# Patient Record
Sex: Male | Born: 1955 | Race: White | Hispanic: No | Marital: Married | Smoking: Never smoker
Health system: Southern US, Community
[De-identification: ages and names within clinical notes are randomized; demographics above are authoritative.]

## PROBLEM LIST (undated history)

## (undated) DIAGNOSIS — N200 Calculus of kidney: Secondary | ICD-10-CM

## (undated) HISTORY — DX: Calculus of kidney: N20.0

---

## 2000-05-15 ENCOUNTER — Emergency Department (HOSPITAL_COMMUNITY): Admission: EM | Admit: 2000-05-15 | Discharge: 2000-05-15 | Payer: Self-pay | Admitting: Emergency Medicine

## 2004-04-07 ENCOUNTER — Encounter: Admission: RE | Admit: 2004-04-07 | Discharge: 2004-05-03 | Payer: Self-pay | Admitting: Family Medicine

## 2004-04-09 ENCOUNTER — Emergency Department (HOSPITAL_COMMUNITY): Admission: EM | Admit: 2004-04-09 | Discharge: 2004-04-09 | Payer: Self-pay | Admitting: Emergency Medicine

## 2008-05-21 ENCOUNTER — Emergency Department (HOSPITAL_COMMUNITY): Admission: EM | Admit: 2008-05-21 | Discharge: 2008-05-21 | Payer: Self-pay | Admitting: Emergency Medicine

## 2008-08-30 ENCOUNTER — Emergency Department (HOSPITAL_COMMUNITY): Admission: EM | Admit: 2008-08-30 | Discharge: 2008-08-30 | Payer: Self-pay | Admitting: Emergency Medicine

## 2008-10-09 ENCOUNTER — Encounter: Admission: RE | Admit: 2008-10-09 | Discharge: 2008-10-09 | Payer: Self-pay | Admitting: Family Medicine

## 2010-07-19 LAB — URINALYSIS, ROUTINE W REFLEX MICROSCOPIC
Glucose, UA: NEGATIVE mg/dL
Specific Gravity, Urine: 1.021 (ref 1.005–1.030)

## 2010-07-19 LAB — URINE MICROSCOPIC-ADD ON

## 2010-09-06 ENCOUNTER — Emergency Department (HOSPITAL_COMMUNITY): Payer: BC Managed Care – PPO

## 2010-09-06 ENCOUNTER — Emergency Department (HOSPITAL_COMMUNITY)
Admission: EM | Admit: 2010-09-06 | Discharge: 2010-09-07 | Disposition: A | Discharge status: Home / Self Care | Payer: BC Managed Care – PPO | Attending: Emergency Medicine | Admitting: Emergency Medicine

## 2010-09-06 DIAGNOSIS — Z87442 Personal history of urinary calculi: Secondary | ICD-10-CM | POA: Insufficient documentation

## 2010-09-06 DIAGNOSIS — E119 Type 2 diabetes mellitus without complications: Secondary | ICD-10-CM | POA: Insufficient documentation

## 2010-09-06 DIAGNOSIS — R0789 Other chest pain: Secondary | ICD-10-CM | POA: Insufficient documentation

## 2010-09-07 LAB — DIFFERENTIAL
Basophils Absolute: 0 10*3/uL (ref 0.0–0.1)
Eosinophils Relative: 4 % (ref 0–5)
Lymphocytes Relative: 32 % (ref 12–46)
Neutro Abs: 4.3 10*3/uL (ref 1.7–7.7)
Neutrophils Relative %: 54 % (ref 43–77)

## 2010-09-07 LAB — COMPREHENSIVE METABOLIC PANEL
ALT: 20 U/L (ref 0–53)
Calcium: 10.8 mg/dL — ABNORMAL HIGH (ref 8.4–10.5)
Glucose, Bld: 119 mg/dL — ABNORMAL HIGH (ref 70–99)
Sodium: 138 mEq/L (ref 135–145)
Total Protein: 7.5 g/dL (ref 6.0–8.3)

## 2010-09-07 LAB — CBC
HCT: 41.4 % (ref 39.0–52.0)
Hemoglobin: 14.9 g/dL (ref 13.0–17.0)
RDW: 12.5 % (ref 11.5–15.5)
WBC: 8 10*3/uL (ref 4.0–10.5)

## 2010-09-07 LAB — CK TOTAL AND CKMB (NOT AT ARMC)
CK, MB: 2.2 ng/mL (ref 0.3–4.0)
Total CK: 88 U/L (ref 7–232)

## 2010-09-16 ENCOUNTER — Institutional Professional Consult (permissible substitution): Payer: BC Managed Care – PPO | Admitting: Internal Medicine

## 2010-09-22 ENCOUNTER — Encounter: Payer: Self-pay | Admitting: Internal Medicine

## 2010-09-23 ENCOUNTER — Encounter: Payer: Self-pay | Admitting: Internal Medicine

## 2010-09-23 ENCOUNTER — Ambulatory Visit (INDEPENDENT_AMBULATORY_CARE_PROVIDER_SITE_OTHER): Payer: BC Managed Care – PPO | Admitting: Internal Medicine

## 2010-09-23 VITALS — BP 110/90 | HR 80 | Resp 18 | Ht 74.0 in | Wt 225.0 lb

## 2010-09-23 DIAGNOSIS — R002 Palpitations: Secondary | ICD-10-CM

## 2010-09-23 DIAGNOSIS — R0789 Other chest pain: Secondary | ICD-10-CM

## 2010-09-23 DIAGNOSIS — R079 Chest pain, unspecified: Secondary | ICD-10-CM | POA: Insufficient documentation

## 2010-09-23 NOTE — Patient Instructions (Addendum)
Your physician has requested that you have an echocardiogram. Echocardiography is a painless test that uses sound waves to create images of your heart. It provides your doctor with information about the size and shape of your heart and how well your heart's chambers and valves are working. This procedure takes approximately one hour. There are no restrictions for this procedure.   Your physician has requested that you have an exercise tolerance test. For further information please visit [email or web address] Please also follow instruction sheet, as given.--with PA   Your physician recommends that you schedule a follow-up appointment in: 6 weeks with Dr Ladona Ridgel

## 2010-09-23 NOTE — Assessment & Plan Note (Signed)
I am not convinced he is having anginal symptoms, but he has some cardiac risk factors and I have asked him to undergo exercise testing. This will be scheduled at the earliest possible time.

## 2010-09-23 NOTE — Assessment & Plan Note (Signed)
The etiology of his symptoms is unclear but is most likely due to SVT. I have asked him to call us if his symptoms worsen. He can come to the office for an ECG if possible. If his symptoms persist but are not caught, then a cardiac monitor will be recommended.

## 2010-09-23 NOTE — Progress Notes (Signed)
HPI Mr. Max Black is referred today by Dr. Duane Lope for evaluation of chest pain and palpitations. 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 His symptoms began several months ago when he began to experience palpitations. He notes that his heart would suddenly feel like it was racing, last for a few minutes and then stop suddenly. He has not experienced syncope, but when he feels palpitations, he experiences dyspnea and minimal chest pressure. At other times he will experience chest discomfort which is not necessarily related to exertion. No family h/o premature CAD. Allergies  Allergen Reactions  . Statins      No current outpatient prescriptions on file.     Past Medical History  Diagnosis Date  . Diabetes mellitus   . Kidney stone     ROS:   All systems reviewed and negative except as noted in the HPI.   No past surgical history on file.   No family history on file.   History   Social History  . Marital Status: Single    Spouse Name: N/A    Number of Children: N/A  . Years of Education: N/A   Occupational History  . Not on file.   Social History Main Topics  . Smoking status: Never Smoker   . Smokeless tobacco: Not on file  . Alcohol Use: No  . Drug Use: No  . Sexually Active: Not on file   Other Topics Concern  . Not on file   Social History Narrative  . No narrative on file     BP 110/90  Pulse 80  Resp 18  Ht 6\' 2"  (1.88 m)  Wt 225 lb (102.059 kg)  BMI 28.89 kg/m2  Physical Exam:  Overweight but Well appearing NAD HEENT: Unremarkable Neck:  No JVD, no thyromegally Lymphatics:  No adenopathy Back:  No CVA tenderness Lungs:  Clear with no wheezes, rales, or rhonchi HEART:  Regular rate rhythm, no murmurs, no rubs, no clicks Abd:   positive bowel sounds, no organomegally, no rebound, no guarding Ext:  2 plus pulses, no edema, no cyanosis, no clubbing Skin:  No rashes no nodules Neuro:  CN II through XII intact, motor grossly  intact  EKG NSR with no ventricular pre-excitation   Assess/Plan:

## 2010-09-29 ENCOUNTER — Encounter: Payer: Self-pay | Admitting: *Deleted

## 2010-10-12 ENCOUNTER — Ambulatory Visit (HOSPITAL_COMMUNITY): Payer: BC Managed Care – PPO | Attending: Internal Medicine | Admitting: Radiology

## 2010-10-12 ENCOUNTER — Ambulatory Visit (INDEPENDENT_AMBULATORY_CARE_PROVIDER_SITE_OTHER): Payer: BC Managed Care – PPO | Admitting: Physician Assistant

## 2010-10-12 DIAGNOSIS — R072 Precordial pain: Secondary | ICD-10-CM | POA: Insufficient documentation

## 2010-10-12 DIAGNOSIS — I379 Nonrheumatic pulmonary valve disorder, unspecified: Secondary | ICD-10-CM | POA: Insufficient documentation

## 2010-10-12 DIAGNOSIS — R0789 Other chest pain: Secondary | ICD-10-CM

## 2010-10-12 DIAGNOSIS — R002 Palpitations: Secondary | ICD-10-CM | POA: Insufficient documentation

## 2010-10-12 NOTE — Progress Notes (Signed)
Exercise Treadmill Test  Pre-Exercise Testing Evaluation Rhythm: normal sinus  Rate: 82   PR:  .16 QRS:  .08  QT:  .38 QTc: .44     Test  Exercise Tolerance Test Ordering MD: Lewayne Bunting, MD  Interpreting MD:  Tereso Newcomer  Unique Test No: 1  Treadmill:  1  Indication for ETT: chest pain - rule out ischemia  Contraindication to ETT: No   Stress Modality: exercise - treadmill  Cardiac Imaging Performed: non   Protocol: standard Bruce - submaximal  Max BP:191/87  Max MPHR (bpm): 165 85% MPR (bpm):  140  MPHR obtained (bpm):  169 % MPHR obtained:  102  Reached 85% MPHR (min:sec):  2:30 Total Exercise Time (min-sec):  5:03  Workload in METS:8.5 Borg Scale: 15  Reason ETT Terminated:  desired heart rate attained    ST Segment Analysis At Rest: normal ST segments - no evidence of significant ST depression With Exercise: no evidence of significant ST depression  Other Information Arrhythmia:  No Angina during ETT:  absent (0) Quality of ETT:  diagnostic  ETT Interpretation:  normal - no evidence of ischemia by ST analysis  Comments: Fair exercise tolerance. Normal BP response to exercise. No chest pain. No ST-T changes to suggest ischemia. PVC, PAC noted  Recommendations: Follow up with Dr. Ladona Ridgel as directed.

## 2010-11-01 ENCOUNTER — Encounter: Payer: Self-pay | Admitting: Internal Medicine

## 2010-11-01 ENCOUNTER — Ambulatory Visit (INDEPENDENT_AMBULATORY_CARE_PROVIDER_SITE_OTHER): Payer: BC Managed Care – PPO | Admitting: Internal Medicine

## 2010-11-01 DIAGNOSIS — R079 Chest pain, unspecified: Secondary | ICD-10-CM

## 2010-11-01 DIAGNOSIS — R002 Palpitations: Secondary | ICD-10-CM

## 2010-11-01 NOTE — Assessment & Plan Note (Signed)
His symptoms appear to be well controlled. I've recommended a period of watchful waiting. If he has recurrent palpitations and the next step would be to have him wear a three-week cardiac monitor. Alternatively, if he has sustained heart racing he is welcome to come by our office for an EKG. Should he have syncope or severe chest pain with this and he is instructed to go to the emergency room.

## 2010-11-01 NOTE — Progress Notes (Signed)
HPI Mr. Max Black returns today for followup. He is a very pleasant middle-aged man with a history of palpitations and associated chest pressure. The patient has had no documented arrhythmias. When I saw him last, he underwent exercise testing and echocardiography. The results demonstrate normal left ventricular systolic function, no exercise induced arrhythmias, and no inducible ischemia with exercise. Since we last saw each other, he has had no sustained palpitations. He denies chest pain, peripheral edema, or shortness of breath. Allergies  Allergen Reactions  . Statins      No current outpatient prescriptions on file.     Past Medical History  Diagnosis Date  . Diabetes mellitus   . Kidney stone     ROS:   All systems reviewed and negative except as noted in the HPI.   No past surgical history on file.   No family history on file.   History   Social History  . Marital Status: Single    Spouse Name: N/A    Number of Children: N/A  . Years of Education: N/A   Occupational History  . Not on file.   Social History Main Topics  . Smoking status: Never Smoker   . Smokeless tobacco: Not on file  . Alcohol Use: No  . Drug Use: No  . Sexually Active: Not on file   Other Topics Concern  . Not on file   Social History Narrative  . No narrative on file     BP 120/78  Pulse 78  Ht 6\' 3"  (1.905 m)  Wt 227 lb (102.967 kg)  BMI 28.37 kg/m2  Physical Exam:  Well appearing NAD HEENT: Unremarkable Neck:  No JVD, no thyromegally Lymphatics:  No adenopathy Back:  No CVA tenderness Lungs:  Clear with no wheezes, rales, or rhonchi. HEART:  Regular rate rhythm, no murmurs, no rubs, no clicks Abd:  soft, positive bowel sounds, no organomegally, no rebound, no guarding Ext:  2 plus pulses, no edema, no cyanosis, no clubbing Skin:  No rashes no nodules Neuro:  CN II through XII intact, motor grossly intact  Assess/Plan:

## 2010-11-01 NOTE — Patient Instructions (Signed)
Your physician recommends that you schedule a follow-up appointment as needed  

## 2010-11-01 NOTE — Assessment & Plan Note (Signed)
He has had no recurrent symptoms. He will undergo a period of watchful waiting.

## 2014-08-03 ENCOUNTER — Other Ambulatory Visit: Payer: Self-pay | Admitting: Family Medicine

## 2014-08-03 DIAGNOSIS — R109 Unspecified abdominal pain: Secondary | ICD-10-CM

## 2014-08-07 ENCOUNTER — Other Ambulatory Visit: Payer: Self-pay

## 2016-06-19 DIAGNOSIS — F909 Attention-deficit hyperactivity disorder, unspecified type: Secondary | ICD-10-CM | POA: Diagnosis not present

## 2016-06-19 DIAGNOSIS — Z79899 Other long term (current) drug therapy: Secondary | ICD-10-CM | POA: Diagnosis not present

## 2016-06-19 DIAGNOSIS — F902 Attention-deficit hyperactivity disorder, combined type: Secondary | ICD-10-CM | POA: Diagnosis not present

## 2016-06-20 DIAGNOSIS — E1165 Type 2 diabetes mellitus with hyperglycemia: Secondary | ICD-10-CM | POA: Diagnosis not present

## 2016-06-21 DIAGNOSIS — E1165 Type 2 diabetes mellitus with hyperglycemia: Secondary | ICD-10-CM | POA: Diagnosis not present

## 2016-09-18 DIAGNOSIS — Z79899 Other long term (current) drug therapy: Secondary | ICD-10-CM | POA: Diagnosis not present

## 2016-09-18 DIAGNOSIS — F909 Attention-deficit hyperactivity disorder, unspecified type: Secondary | ICD-10-CM | POA: Diagnosis not present

## 2016-12-19 DIAGNOSIS — Z79899 Other long term (current) drug therapy: Secondary | ICD-10-CM | POA: Diagnosis not present

## 2016-12-19 DIAGNOSIS — E119 Type 2 diabetes mellitus without complications: Secondary | ICD-10-CM | POA: Diagnosis not present

## 2016-12-19 DIAGNOSIS — F909 Attention-deficit hyperactivity disorder, unspecified type: Secondary | ICD-10-CM | POA: Diagnosis not present

## 2017-03-15 DIAGNOSIS — F909 Attention-deficit hyperactivity disorder, unspecified type: Secondary | ICD-10-CM | POA: Diagnosis not present

## 2017-03-15 DIAGNOSIS — E119 Type 2 diabetes mellitus without complications: Secondary | ICD-10-CM | POA: Diagnosis not present

## 2017-03-15 DIAGNOSIS — F902 Attention-deficit hyperactivity disorder, combined type: Secondary | ICD-10-CM | POA: Diagnosis not present

## 2017-03-15 DIAGNOSIS — Z79899 Other long term (current) drug therapy: Secondary | ICD-10-CM | POA: Diagnosis not present

## 2017-03-20 DIAGNOSIS — N486 Induration penis plastica: Secondary | ICD-10-CM | POA: Diagnosis not present

## 2017-03-20 DIAGNOSIS — N5201 Erectile dysfunction due to arterial insufficiency: Secondary | ICD-10-CM | POA: Diagnosis not present

## 2017-04-24 DIAGNOSIS — E1165 Type 2 diabetes mellitus with hyperglycemia: Secondary | ICD-10-CM | POA: Diagnosis not present

## 2017-04-24 DIAGNOSIS — Z0000 Encounter for general adult medical examination without abnormal findings: Secondary | ICD-10-CM | POA: Diagnosis not present

## 2017-04-26 DIAGNOSIS — E1165 Type 2 diabetes mellitus with hyperglycemia: Secondary | ICD-10-CM | POA: Diagnosis not present

## 2017-04-26 DIAGNOSIS — E7800 Pure hypercholesterolemia, unspecified: Secondary | ICD-10-CM | POA: Diagnosis not present

## 2017-04-26 DIAGNOSIS — K130 Diseases of lips: Secondary | ICD-10-CM | POA: Diagnosis not present

## 2017-04-26 DIAGNOSIS — Z0000 Encounter for general adult medical examination without abnormal findings: Secondary | ICD-10-CM | POA: Diagnosis not present

## 2017-06-12 DIAGNOSIS — Z79899 Other long term (current) drug therapy: Secondary | ICD-10-CM | POA: Diagnosis not present

## 2017-06-12 DIAGNOSIS — F909 Attention-deficit hyperactivity disorder, unspecified type: Secondary | ICD-10-CM | POA: Diagnosis not present

## 2017-07-23 DIAGNOSIS — E7800 Pure hypercholesterolemia, unspecified: Secondary | ICD-10-CM | POA: Diagnosis not present

## 2017-07-23 DIAGNOSIS — E1165 Type 2 diabetes mellitus with hyperglycemia: Secondary | ICD-10-CM | POA: Diagnosis not present

## 2017-07-25 DIAGNOSIS — E7800 Pure hypercholesterolemia, unspecified: Secondary | ICD-10-CM | POA: Diagnosis not present

## 2017-07-25 DIAGNOSIS — E1165 Type 2 diabetes mellitus with hyperglycemia: Secondary | ICD-10-CM | POA: Diagnosis not present

## 2017-09-13 DIAGNOSIS — F909 Attention-deficit hyperactivity disorder, unspecified type: Secondary | ICD-10-CM | POA: Diagnosis not present

## 2017-09-13 DIAGNOSIS — Z79899 Other long term (current) drug therapy: Secondary | ICD-10-CM | POA: Diagnosis not present

## 2017-11-13 DIAGNOSIS — E1165 Type 2 diabetes mellitus with hyperglycemia: Secondary | ICD-10-CM | POA: Diagnosis not present

## 2017-11-15 DIAGNOSIS — E1165 Type 2 diabetes mellitus with hyperglycemia: Secondary | ICD-10-CM | POA: Diagnosis not present

## 2017-11-30 ENCOUNTER — Encounter (HOSPITAL_COMMUNITY): Payer: Self-pay | Admitting: *Deleted

## 2017-11-30 ENCOUNTER — Emergency Department (HOSPITAL_COMMUNITY)
Admission: EM | Admit: 2017-11-30 | Discharge: 2017-11-30 | Disposition: A | Discharge status: Home / Self Care | Payer: BLUE CROSS/BLUE SHIELD | Attending: Emergency Medicine | Admitting: Emergency Medicine

## 2017-11-30 ENCOUNTER — Other Ambulatory Visit: Payer: Self-pay

## 2017-11-30 ENCOUNTER — Emergency Department (HOSPITAL_COMMUNITY): Payer: BLUE CROSS/BLUE SHIELD

## 2017-11-30 DIAGNOSIS — N200 Calculus of kidney: Secondary | ICD-10-CM | POA: Insufficient documentation

## 2017-11-30 DIAGNOSIS — N130 Hydronephrosis with ureteropelvic junction obstruction: Secondary | ICD-10-CM | POA: Insufficient documentation

## 2017-11-30 DIAGNOSIS — E119 Type 2 diabetes mellitus without complications: Secondary | ICD-10-CM | POA: Diagnosis not present

## 2017-11-30 DIAGNOSIS — N201 Calculus of ureter: Secondary | ICD-10-CM | POA: Diagnosis not present

## 2017-11-30 DIAGNOSIS — Z79899 Other long term (current) drug therapy: Secondary | ICD-10-CM | POA: Diagnosis not present

## 2017-11-30 DIAGNOSIS — R1031 Right lower quadrant pain: Secondary | ICD-10-CM | POA: Diagnosis not present

## 2017-11-30 DIAGNOSIS — Z7984 Long term (current) use of oral hypoglycemic drugs: Secondary | ICD-10-CM | POA: Diagnosis not present

## 2017-11-30 LAB — I-STAT CHEM 8, ED
BUN: 25 mg/dL — ABNORMAL HIGH (ref 8–23)
Calcium, Ion: 1.17 mmol/L (ref 1.15–1.40)
Chloride: 99 mmol/L (ref 98–111)
Creatinine, Ser: 1.3 mg/dL — ABNORMAL HIGH (ref 0.61–1.24)
Glucose, Bld: 361 mg/dL — ABNORMAL HIGH (ref 70–99)
HCT: 45 % (ref 39.0–52.0)
Hemoglobin: 15.3 g/dL (ref 13.0–17.0)
Potassium: 3.9 mmol/L (ref 3.5–5.1)
Sodium: 134 mmol/L — ABNORMAL LOW (ref 135–145)
TCO2: 24 mmol/L (ref 22–32)

## 2017-11-30 LAB — URINALYSIS, ROUTINE W REFLEX MICROSCOPIC
Bacteria, UA: NONE SEEN
Bilirubin Urine: NEGATIVE
Glucose, UA: >=500 mg/dL — AB
Hgb urine dipstick: NEGATIVE
Ketones, ur: 5 mg/dL — AB
Leukocytes, UA: NEGATIVE
Nitrite: NEGATIVE
Protein, ur: NEGATIVE mg/dL
Specific Gravity, Urine: 1.026 (ref 1.005–1.030)
pH: 6 (ref 5.0–8.0)

## 2017-11-30 MED ORDER — HYDROMORPHONE HCL 1 MG/ML IJ SOLN
1.0000 mg | Freq: Once | INTRAMUSCULAR | Status: AC
  Administered 2017-11-30: 1 mg via INTRAVENOUS
  Filled 2017-11-30: qty 1

## 2017-11-30 MED ORDER — KETOROLAC TROMETHAMINE 30 MG/ML IJ SOLN
15.0000 mg | Freq: Once | INTRAMUSCULAR | Status: AC
  Administered 2017-11-30: 15 mg via INTRAVENOUS
  Filled 2017-11-30: qty 1

## 2017-11-30 MED ORDER — SODIUM CHLORIDE 0.9 % IV BOLUS
1000.0000 mL | Freq: Once | INTRAVENOUS | Status: AC
  Administered 2017-11-30: 1000 mL via INTRAVENOUS

## 2017-11-30 MED ORDER — HYDROCODONE-ACETAMINOPHEN 5-325 MG PO TABS: 1.0000 | ORAL_TABLET | Freq: Four times a day (QID) | ORAL | 0 refills | Status: DC | PRN

## 2017-11-30 MED ORDER — ONDANSETRON HCL 4 MG/2ML IJ SOLN
4.0000 mg | Freq: Once | INTRAMUSCULAR | Status: AC
  Administered 2017-11-30: 4 mg via INTRAVENOUS
  Filled 2017-11-30: qty 2

## 2017-11-30 NOTE — Discharge Instructions (Addendum)
Return here as needed.  Follow-up with your urologist.  Increase your fluid intake.

## 2017-11-30 NOTE — ED Triage Notes (Signed)
Pt arrives ambulatory to triage with c/o right lower abdominal and side pain since about 3 am, associated with n/v and urinary urgency. He feels as though he may have already passed a small kidney stone, but still having pain. Reports he has known kidney stones. No meds PTA.

## 2017-11-30 NOTE — ED Provider Notes (Signed)
[facility]-EM[facility]ENCY DEPT Provider Note   CSN: [id number] Arrival date & time: 11/30/17  0603     History   Chief Complaint Chief Complaint  Patient presents with  . Flank Pain    HPI Max Black is a 62 y.o. male.  HPI Patient presents to the emergency department with sudden onset of right flank and groin pain that started at 3 AM.  The patient feels like is having a kidney stone that is causing pain.  Patient states had these multiple times in the past.  The patient states that nothing seems to make the condition better or worse.  Patient states that he did not take any medications prior to arrival.  Patient states that he feels as though he may have already passed several small stones recently.  The patient denies chest pain, shortness of breath, headache,blurred vision, neck pain, fever, cough, weakness, numbness, dizziness, anorexia, edema, abdominal pain, nausea, vomiting, diarrhea, rash, back pain, dysuria, hematemesis, bloody stool, near syncope, or syncope. Past Medical History:  Diagnosis Date  . Diabetes mellitus   . Kidney stone     Patient Active Problem List   Diagnosis Date Noted  . Chest pain 09/23/2010  . Palpitations 09/23/2010    History reviewed. No pertinent surgical history.      Home Medications    Prior to Admission medications   Medication Sig Start Date End Date Taking? Authorizing Provider  b complex vitamins capsule Take 1 capsule by mouth daily.   Yes [provider]  cholecalciferol (VITAMIN D) 1000 units tablet Take 2,000 Units by mouth daily.   Yes [provider]  loratadine (CLARITIN) 10 MG tablet Take 10 mg by mouth daily.   Yes [provider]  metFORMIN (GLUCOPHAGE-XR) 500 MG 24 hr tablet Take 500 mg by mouth every evening.   Yes [provider]  pravastatin (PRAVACHOL) 20 MG tablet Take 20 mg by mouth as directed. Patient is taking this twice weekly on no certain day  of the week   Yes [provider]    Family History No family history on file.  Social History Social History   Tobacco Use  . Smoking status: Never Smoker  Substance Use Topics  . Alcohol use: No  . Drug use: No     Allergies   Patient has no active allergies.   Review of Systems Review of Systems All other systems negative except as documented in the HPI. All pertinent positives and negatives as reviewed in the HPI.  Physical Exam Updated Vital Signs BP (!) 178/106 (BP Location: Right Arm)   Pulse 76   Temp 97.9 F (36.6 C) (Oral)   Resp 20   SpO2 99%   Physical Exam  Constitutional: He is oriented to person, place, and time. He appears well-developed and well-nourished. No distress.  HENT:  Head: Normocephalic and atraumatic.  Mouth/Throat: Oropharynx is clear and moist.  Eyes: Pupils are equal, round, and reactive to light.  Neck: Normal range of motion. Neck supple.  Cardiovascular: Normal rate, regular rhythm and normal heart sounds. Exam reveals no gallop and no friction rub.  No murmur heard. Pulmonary/Chest: Effort normal and breath sounds normal. No respiratory distress. He has no wheezes.  Abdominal: Soft. Bowel sounds are normal. He exhibits no distension. There is no tenderness.  Neurological: He is alert and oriented to person, place, and time. He exhibits normal muscle tone. Coordination normal.  Skin: Skin is warm and dry. Capillary refill takes less  than 2 seconds. No rash noted. No erythema.  Psychiatric: He has a normal mood and affect. His behavior is normal.  Nursing note and vitals reviewed.    ED Treatments / Results  Labs (all labs ordered are listed, but only abnormal results are displayed) Labs Reviewed  URINALYSIS, ROUTINE W REFLEX MICROSCOPIC - Abnormal; Notable for the following components:      Result Value   Glucose, UA >=500 (*)    Ketones, ur 5 (*)    All other components within normal limits  I-STAT CHEM 8, ED -  Abnormal; Notable for the following components:   Sodium 134 (*)    BUN 25 (*)    Creatinine, Ser 1.30 (*)    Glucose, Bld 361 (*)    All other components within normal limits    EKG None  Radiology Ct Renal Stone Study  Result Date: 11/30/2017 CLINICAL DATA:  Right-sided abdominal pain beginning 6 hours ago history of previous kidney stones. EXAM: CT ABDOMEN AND PELVIS WITHOUT CONTRAST TECHNIQUE: Multidetector CT imaging of the abdomen and pelvis was performed following the standard protocol without IV contrast. COMPARISON:  08/03/2014 FINDINGS: Lower chest: Chronic pleural and parenchymal scarring at the left base. Hepatobiliary: Chronic 9 mm cyst in the ventral liver. Diffuse fatty change of the liver. Tiny wall calcification of the gallbladder. No calcified gallstones are seen. Pancreas: Normal Spleen: Normal.  Scattered granulomas. Adrenals/Urinary Tract: Adrenal glands are normal. Left kidney contains multiple nonobstructing calculi, the largest measuring 6 mm in the lower pole. No sign of passing stone on the left. On the right, there is renal swelling with surrounding edema. There are nonobstructing renal calculi, the largest in the lower pole measuring 7 mm. There is hydroureteronephrosis with the ureter being dilated to the level just proximal to the UVJ. There is an obstructing 3 mm stone in this location. No stone in the bladder. Stomach/Bowel: Normal except for sigmoid diverticulosis. Vascular/Lymphatic: Aortic atherosclerosis. No aneurysm. IVC is normal. No retroperitoneal adenopathy. Reproductive: Normal Other: No free fluid or air. Musculoskeletal: Ordinary spondylosis. IMPRESSION: Hydroureteronephrosis on the right due to a 3 mm stone just proximal to the UVJ. Several nonobstructing renal calculi in each kidney. Diffuse fatty change of the liver. 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 Aortic atherosclerosis. Electronically Signed   By: Paulina Fusi M.D.   On: 11/30/2017 09:15     Procedures Procedures (including critical care time)  Medications Ordered in ED Medications  HYDROmorphone (DILAUDID) injection 1 mg (1 mg Intravenous Given 11/30/17 0734)  ondansetron (ZOFRAN) injection 4 mg (4 mg Intravenous Given 11/30/17 0734)  sodium chloride 0.9 % bolus 1,000 mL (1,000 mLs Intravenous New Bag/Given 11/30/17 0936)  HYDROmorphone (DILAUDID) injection 1 mg (1 mg Intravenous Given 11/30/17 0935)  ketorolac (TORADOL) 30 MG/ML injection 15 mg (15 mg Intravenous Given 11/30/17 0935)     Initial Impression / Assessment and Plan / ED Course  I have reviewed the triage vital signs and the nursing notes.  Pertinent labs & imaging results that were available during my care of the patient were reviewed by me and considered in my medical decision making (see chart for details).     Patient has a 3 mm stone at the UVJ.  The patient is feeling resolution of the pressure and feels like it may have passed into the bladder but he is unsure.  The patient states that he is having the pain right now.  Patient will be advised to follow-up with his urologist  as soon as possible.  Told to return here as needed.  Patient agrees with plan and all questions were answered.  Final Clinical Impressions(s) / ED Diagnoses   Final diagnoses:  None    ED Discharge Orders    None       Charlestine NightLawyer, Andi Mahaffy, PA-C 11/30/17 1119    Margarita Grizzleay, Danielle, MD 12/03/17 [phone]

## 2017-12-01 ENCOUNTER — Encounter (HOSPITAL_COMMUNITY)
Admission: EM | Disposition: A | Discharge status: Home / Self Care | Payer: Self-pay | Source: Home / Self Care | Attending: Emergency Medicine

## 2017-12-01 ENCOUNTER — Emergency Department (HOSPITAL_COMMUNITY): Payer: BLUE CROSS/BLUE SHIELD | Admitting: Certified Registered Nurse Anesthetist

## 2017-12-01 ENCOUNTER — Encounter (HOSPITAL_COMMUNITY): Payer: Self-pay | Admitting: *Deleted

## 2017-12-01 ENCOUNTER — Other Ambulatory Visit: Payer: Self-pay

## 2017-12-01 ENCOUNTER — Emergency Department (HOSPITAL_COMMUNITY): Payer: BLUE CROSS/BLUE SHIELD

## 2017-12-01 ENCOUNTER — Emergency Department (HOSPITAL_COMMUNITY)
Admission: EM | Admit: 2017-12-01 | Discharge: 2017-12-01 | Disposition: A | Discharge status: Home / Self Care | Payer: BLUE CROSS/BLUE SHIELD | Attending: Urology | Admitting: Urology

## 2017-12-01 DIAGNOSIS — Z7984 Long term (current) use of oral hypoglycemic drugs: Secondary | ICD-10-CM | POA: Insufficient documentation

## 2017-12-01 DIAGNOSIS — N23 Unspecified renal colic: Secondary | ICD-10-CM

## 2017-12-01 DIAGNOSIS — E119 Type 2 diabetes mellitus without complications: Secondary | ICD-10-CM | POA: Diagnosis not present

## 2017-12-01 DIAGNOSIS — N201 Calculus of ureter: Secondary | ICD-10-CM

## 2017-12-01 DIAGNOSIS — N289 Disorder of kidney and ureter, unspecified: Secondary | ICD-10-CM | POA: Diagnosis not present

## 2017-12-01 DIAGNOSIS — Z87442 Personal history of urinary calculi: Secondary | ICD-10-CM | POA: Diagnosis not present

## 2017-12-01 DIAGNOSIS — N132 Hydronephrosis with renal and ureteral calculous obstruction: Secondary | ICD-10-CM | POA: Diagnosis not present

## 2017-12-01 DIAGNOSIS — Z79899 Other long term (current) drug therapy: Secondary | ICD-10-CM | POA: Diagnosis not present

## 2017-12-01 DIAGNOSIS — N179 Acute kidney failure, unspecified: Secondary | ICD-10-CM | POA: Diagnosis not present

## 2017-12-01 HISTORY — PX: CYSTOSCOPY/RETROGRADE/URETEROSCOPY/STONE EXTRACTION WITH BASKET: SHX5317

## 2017-12-01 LAB — BASIC METABOLIC PANEL
Anion gap: 9 (ref 5–15)
BUN: 23 mg/dL (ref 8–23)
CALCIUM: 8.9 mg/dL (ref 8.9–10.3)
CO2: 27 mmol/L (ref 22–32)
Chloride: 99 mmol/L (ref 98–111)
Creatinine, Ser: 1.82 mg/dL — ABNORMAL HIGH (ref 0.61–1.24)
GFR calc Af Amer: 44 mL/min — ABNORMAL LOW (ref >60)
GFR calc non Af Amer: 38 mL/min — ABNORMAL LOW (ref >60)
GLUCOSE: 236 mg/dL — AB (ref 70–99)
Potassium: 4.1 mmol/L (ref 3.5–5.1)
Sodium: 135 mmol/L (ref 135–145)

## 2017-12-01 LAB — CBC WITH DIFFERENTIAL/PLATELET
Basophils Absolute: 0 10*3/uL (ref 0.0–0.1)
Basophils Relative: 0 %
EOS PCT: 2 %
Eosinophils Absolute: 0.2 10*3/uL (ref 0.0–0.7)
HEMATOCRIT: 40.1 % (ref 39.0–52.0)
Hemoglobin: 14.2 g/dL (ref 13.0–17.0)
LYMPHS PCT: 12 %
Lymphs Abs: 1.2 10*3/uL (ref 0.7–4.0)
MCH: 31.2 pg (ref 26.0–34.0)
MCHC: 35.4 g/dL (ref 30.0–36.0)
MCV: 88.1 fL (ref 78.0–100.0)
MONO ABS: 1.2 10*3/uL — AB (ref 0.1–1.0)
MONOS PCT: 11 %
NEUTROS ABS: 8 10*3/uL — AB (ref 1.7–7.7)
Neutrophils Relative %: 75 %
PLATELETS: 203 10*3/uL (ref 150–400)
RBC: 4.55 MIL/uL (ref 4.22–5.81)
RDW: 12.8 % (ref 11.5–15.5)
WBC: 10.6 10*3/uL — ABNORMAL HIGH (ref 4.0–10.5)

## 2017-12-01 LAB — GLUCOSE, CAPILLARY: Glucose-Capillary: 237 mg/dL — ABNORMAL HIGH (ref 70–99)

## 2017-12-01 LAB — URINALYSIS, ROUTINE W REFLEX MICROSCOPIC
Bacteria, UA: NONE SEEN
Bilirubin Urine: NEGATIVE
Hgb urine dipstick: NEGATIVE
Ketones, ur: NEGATIVE mg/dL
Leukocytes, UA: NEGATIVE
Nitrite: NEGATIVE
PH: 5 (ref 5.0–8.0)
Protein, ur: NEGATIVE mg/dL
SPECIFIC GRAVITY, URINE: 1.02 (ref 1.005–1.030)

## 2017-12-01 SURGERY — CYSTOSCOPY, WITH CALCULUS REMOVAL USING BASKET
Anesthesia: General | Site: Ureter | Laterality: Right

## 2017-12-01 MED ORDER — DEXAMETHASONE SODIUM PHOSPHATE 10 MG/ML IJ SOLN
INTRAMUSCULAR | Status: AC
  Filled 2017-12-01: qty 1

## 2017-12-01 MED ORDER — ONDANSETRON HCL 4 MG/2ML IJ SOLN: 4.0000 mg | Freq: Once | INTRAMUSCULAR | Status: DC | PRN

## 2017-12-01 MED ORDER — FENTANYL CITRATE (PF) 100 MCG/2ML IJ SOLN
100.0000 ug | Freq: Once | INTRAMUSCULAR | Status: AC
  Administered 2017-12-01: 100 ug via INTRAVENOUS
  Filled 2017-12-01: qty 2

## 2017-12-01 MED ORDER — ONDANSETRON HCL 4 MG/2ML IJ SOLN
4.0000 mg | Freq: Once | INTRAMUSCULAR | Status: AC
  Administered 2017-12-01: 4 mg via INTRAVENOUS
  Filled 2017-12-01: qty 2

## 2017-12-01 MED ORDER — LACTATED RINGERS IV SOLN
INTRAVENOUS | Status: DC | PRN
  Administered 2017-12-01: 08:00:00 via INTRAVENOUS

## 2017-12-01 MED ORDER — PHENYLEPHRINE 40 MCG/ML (10ML) SYRINGE FOR IV PUSH (FOR BLOOD PRESSURE SUPPORT)
PREFILLED_SYRINGE | INTRAVENOUS | Status: AC
  Filled 2017-12-01: qty 10

## 2017-12-01 MED ORDER — HYDROMORPHONE HCL 1 MG/ML IJ SOLN
1.0000 mg | Freq: Once | INTRAMUSCULAR | Status: AC
  Administered 2017-12-01: 1 mg via INTRAVENOUS
  Filled 2017-12-01: qty 1

## 2017-12-01 MED ORDER — CIPROFLOXACIN IN D5W 400 MG/200ML IV SOLN
INTRAVENOUS | Status: DC | PRN
  Administered 2017-12-01: 400 mg via INTRAVENOUS

## 2017-12-01 MED ORDER — FENTANYL CITRATE (PF) 100 MCG/2ML IJ SOLN
INTRAMUSCULAR | Status: AC
  Filled 2017-12-01: qty 2

## 2017-12-01 MED ORDER — PHENAZOPYRIDINE HCL 200 MG PO TABS: 200.0000 mg | ORAL_TABLET | Freq: Three times a day (TID) | ORAL | 0 refills | Status: DC | PRN

## 2017-12-01 MED ORDER — MIDAZOLAM HCL 2 MG/2ML IJ SOLN
INTRAMUSCULAR | Status: DC | PRN
  Administered 2017-12-01: 2 mg via INTRAVENOUS

## 2017-12-01 MED ORDER — PROPOFOL 10 MG/ML IV BOLUS
INTRAVENOUS | Status: AC
  Filled 2017-12-01: qty 40

## 2017-12-01 MED ORDER — ONDANSETRON HCL 4 MG/2ML IJ SOLN
INTRAMUSCULAR | Status: AC
  Filled 2017-12-01: qty 2

## 2017-12-01 MED ORDER — ONDANSETRON HCL 4 MG/2ML IJ SOLN
INTRAMUSCULAR | Status: DC | PRN
  Administered 2017-12-01: 4 mg via INTRAVENOUS

## 2017-12-01 MED ORDER — FENTANYL CITRATE (PF) 100 MCG/2ML IJ SOLN
INTRAMUSCULAR | Status: DC | PRN
  Administered 2017-12-01 (×2): 50 ug via INTRAVENOUS

## 2017-12-01 MED ORDER — PROPOFOL 10 MG/ML IV BOLUS
INTRAVENOUS | Status: DC | PRN
  Administered 2017-12-01: 200 mg via INTRAVENOUS

## 2017-12-01 MED ORDER — MIDAZOLAM HCL 2 MG/2ML IJ SOLN
INTRAMUSCULAR | Status: AC
  Filled 2017-12-01: qty 2

## 2017-12-01 MED ORDER — LIDOCAINE 2% (20 MG/ML) 5 ML SYRINGE
INTRAMUSCULAR | Status: AC
  Filled 2017-12-01: qty 5

## 2017-12-01 MED ORDER — LIDOCAINE 2% (20 MG/ML) 5 ML SYRINGE
INTRAMUSCULAR | Status: DC | PRN
  Administered 2017-12-01: 60 mg via INTRAVENOUS

## 2017-12-01 MED ORDER — DEXAMETHASONE SODIUM PHOSPHATE 4 MG/ML IJ SOLN
INTRAMUSCULAR | Status: DC | PRN
  Administered 2017-12-01: 5 mg via INTRAVENOUS

## 2017-12-01 MED ORDER — SODIUM CHLORIDE 0.9 % IR SOLN
Status: DC | PRN
  Administered 2017-12-01: 3000 mL
  Administered 2017-12-01: 1000 mL

## 2017-12-01 MED ORDER — SODIUM CHLORIDE 0.9 % IV BOLUS (SEPSIS)
1000.0000 mL | Freq: Once | INTRAVENOUS | Status: AC
  Administered 2017-12-01: 1000 mL via INTRAVENOUS

## 2017-12-01 MED ORDER — CIPROFLOXACIN IN D5W 400 MG/200ML IV SOLN
INTRAVENOUS | Status: AC
  Filled 2017-12-01: qty 200

## 2017-12-01 MED ORDER — FENTANYL CITRATE (PF) 100 MCG/2ML IJ SOLN: 25.0000 ug | INTRAMUSCULAR | Status: DC | PRN

## 2017-12-01 SURGICAL SUPPLY — 20 items
BAG URO CATCHER STRL LF (MISCELLANEOUS) ×2 IMPLANT
BASKET ZERO TIP 1.9FR (BASKET) ×2 IMPLANT
BASKET ZERO TIP NITINOL 2.4FR (BASKET) ×1 IMPLANT
BSKT STON RTRVL ZERO TP 1.9FR (BASKET) ×1
BSKT STON RTRVL ZERO TP 2.4FR (BASKET) ×1
CATH INTERMIT  6FR 70CM (CATHETERS) ×2 IMPLANT
CLOTH BEACON ORANGE TIMEOUT ST (SAFETY) ×2 IMPLANT
EXTRACTOR STONE 1.7FRX115CM (UROLOGICAL SUPPLIES) IMPLANT
GLOVE BIOGEL M 8.0 STRL (GLOVE) ×2 IMPLANT
GOWN STRL REUS W/TWL XL LVL3 (GOWN DISPOSABLE) ×2 IMPLANT
GUIDEWIRE ANG ZIPWIRE 038X150 (WIRE) IMPLANT
GUIDEWIRE STR DUAL SENSOR (WIRE) ×2 IMPLANT
IV NS 1000ML (IV SOLUTION) ×2
IV NS 1000ML BAXH (IV SOLUTION) ×1 IMPLANT
MANIFOLD NEPTUNE II (INSTRUMENTS) ×2 IMPLANT
PACK CYSTO (CUSTOM PROCEDURE TRAY) ×2 IMPLANT
SHEATH URETERAL 12FRX28CM (UROLOGICAL SUPPLIES) ×1 IMPLANT
SHEATH URETERAL 12FRX35CM (MISCELLANEOUS) IMPLANT
TUBING CONNECTING 10 (TUBING) ×2 IMPLANT
TUBING UROLOGY SET (TUBING) ×2 IMPLANT

## 2017-12-01 NOTE — Anesthesia Postprocedure Evaluation (Signed)
Anesthesia Post Note  Patient: Max Black  Procedure(s) Performed: CYSTOSCOPY/RIGHT RETROGRADE/RIGHT URETEROSCOPY/STONE EXTRACTION WITH BASKET (Right Ureter)     Patient location during evaluation: PACU Anesthesia Type: General Level of consciousness: awake and alert Pain management: pain level controlled Vital Signs Assessment: post-procedure vital signs reviewed and stable Respiratory status: spontaneous breathing, nonlabored ventilation, respiratory function stable and patient connected to nasal cannula oxygen Cardiovascular status: blood pressure returned to baseline and stable Postop Assessment: no apparent nausea or vomiting Anesthetic complications: no    Last Vitals:  Vitals:   12/01/17 0930 12/01/17 0936  BP: 128/88 133/79  Pulse: 77 81  Resp: 15 18  Temp: 36.6 C   SpO2: 98% 99%    Last Pain:  Vitals:   12/01/17 0930  TempSrc:   PainSc: 0-No pain                 Kennieth RadFitzgerald, Larosa Rhines E

## 2017-12-01 NOTE — ED Triage Notes (Signed)
Pt arrives ambulatory to triage with c/o pain in the right flank area. Pt seen yesterday morning in the ED for kidney stone. Pt reports he has had decreased urine flow and spasm like flank pain. Last took pain meds around 2330. No n/v.

## 2017-12-01 NOTE — ED Notes (Signed)
In to reassess patient again, says he feels like the pain is rising, does not want pain medication, just wants to reposition. Aware to notify RN if he wants pain meds.

## 2017-12-01 NOTE — Op Note (Signed)
PATIENT:  Max Black  PRE-OPERATIVE DIAGNOSIS: Right ureteral calculus  POST-OPERATIVE DIAGNOSIS: Same  PROCEDURE: 1.  Cystoscopy with right retrograde pyelogram including interpretation. 2.  Right ureteroscopy and stone extraction.  SURGEON:  Garnett FarmMark C Arjay Jaskiewicz  INDICATION: Max Black is a 62 year old male with a known history of calculus disease who developed right renal colic due to a 3 mm stone in the distal right ureter seen on CT scan yesterday.  He was having severe colic that was controlled but returned to 24 hours later to the emergency room with recurrent right renal colic.  He has elected to proceed with ureteroscopic management of his stone.  ANESTHESIA:  General  EBL:  Minimal  DRAINS: None  LOCAL MEDICATIONS USED:  None  SPECIMEN: Stone given to patient  Description of procedure: After informed consent the patient was taken to the operating room and placed on the table in a supine position. General anesthesia was then administered. Once fully anesthetized the patient was moved to the dorsal lithotomy position and the genitalia were sterilely prepped and draped in standard fashion. An official timeout was then performed.  Initially the 23 French cystoscope was passed under direct vision down the urethra which is noted to be entirely normal.  The prostatic urethra was unremarkable with some mild bilobar hypertrophy but no lesions.  The bladder was then entered.  It was fully and systematically inspected noted to be free of any tumors, stones or inflammatory lesions.  His right and left ureteral orifice were noted to be of normal configuration and position.  A 6 French open-ended ureteral catheter was then passed through the cystoscope and into the right ureteral orifice in preparation for right retrograde pyelogram.  Full-strength Omnipaque contrast was injected through the open-ended catheter and up the right ureter under fluoroscopy.  It revealed a filling  defect in the distal right ureter consistent with his previously noted stone.  There were no other filling defects but he did have hydronephrosis proximal to the small stone noted on retrograde.  I therefore passed a 0.038 inch floppy tip sensor guidewire through the open-ended catheter and into the area the renal pelvis and left this in place as I removed the open-ended catheter and cystoscope.  The inner portion of the ureteral access sheath was passed over the guidewire in order to gently dilate the intramural ureter.  I then proceeded using a rigid 6 French ureteroscope and passed this under direct vision into the bladder and into the right ureteral orifice where the stone was identified.  It was noted to be smooth and felt to be small enough to be extracted easily and therefore I chose a 0 tip nitinol basket and passed this through the ureteroscope and engaged the stone.  Once the stone was engaged I extracted the ureteroscope and stone simultaneously and these were easily removed with no resistance.  Because there was little to no trauma of the ureter I did not feel stent placement was necessary and therefore the patient was awakened and taken to the recovery room in stable and satisfactory condition.  He tolerated the procedure well with no intraoperative complications.  PLAN OF CARE: Discharge to home after PACU  PATIENT DISPOSITION:  PACU - hemodynamically stable.

## 2017-12-01 NOTE — ED Provider Notes (Signed)
[facility]-EMERGENCY DEPT Provider Note   CSN: [id number] Arrival date & time: 12/01/17  0119     History   Chief Complaint Chief Complaint  Patient presents with  . Flank Pain    HPI Max Black is a 62 y.o. male.  The history is provided by the patient and the spouse.  Flank Pain  This is a recurrent problem. The problem occurs constantly. The problem has been rapidly worsening. Associated symptoms include abdominal pain. Pertinent negatives include no chest pain and no shortness of breath. Nothing aggravates the symptoms. Nothing relieves the symptoms.   Patient recently diagnosed with a right ureteral stone, which was improved in the ER.  He went home, but the pain has now returned.  No fever/vomiting He reports low back pain as well as abdominal pain. Reports previous history of kidney stones but denies any procedures Past Medical History:  Diagnosis Date  . Diabetes mellitus   . Kidney stone     Patient Active Problem List   Diagnosis Date Noted  . Chest pain 09/23/2010  . Palpitations 09/23/2010    History reviewed. No pertinent surgical history.      Home Medications    Prior to Admission medications   Medication Sig Start Date End Date Taking? Authorizing Provider  b complex vitamins capsule Take 1 capsule by mouth daily.    [provider]  cholecalciferol (VITAMIN D) 1000 units tablet Take 2,000 Units by mouth daily.    [provider]  HYDROcodone-acetaminophen (NORCO/VICODIN) 5-325 MG tablet Take 1 tablet by mouth every 6 (six) hours as needed for moderate pain. 11/30/17   Lawyer, Cristal Deer, PA-C  loratadine (CLARITIN) 10 MG tablet Take 10 mg by mouth daily.    [provider]  metFORMIN (GLUCOPHAGE-XR) 500 MG 24 hr tablet Take 500 mg by mouth every evening.    [provider]  pravastatin (PRAVACHOL) 20 MG tablet Take 20 mg by mouth as directed. Patient is taking this twice weekly on  no certain day of the week    [provider]    Family History No family history on file.  Social History Social History   Tobacco Use  . Smoking status: Never Smoker  Substance Use Topics  . Alcohol use: No  . Drug use: No     Allergies   Patient has no active allergies.   Review of Systems Review of Systems  Constitutional: Negative for fever.  Respiratory: Negative for shortness of breath.   Cardiovascular: Negative for chest pain.  Gastrointestinal: Positive for abdominal pain and nausea. Negative for vomiting.  Genitourinary: Positive for flank pain. Negative for testicular pain.  All other systems reviewed and are negative.    Physical Exam Updated Vital Signs BP (!) 147/84 (BP Location: Right Arm)   Pulse 71   Temp 98.2 F (36.8 C) (Oral)   Resp 16   Ht 1.905 m (6\' 3" )   Wt 103 kg   SpO2 97%   BMI 28.37 kg/m   Physical Exam  CONSTITUTIONAL: Well developed/well nourished, uncomfortable appearing HEAD: Normocephalic/atraumatic EYES: EOMI/PERRL ENMT: Mucous membranes moist NECK: supple no meningeal signs SPINE/BACK:entire spine nontender CV: S1/S2 noted, no murmurs/rubs/gallops noted LUNGS: Lungs are clear to auscultation bilaterally, no apparent distress ABDOMEN: soft, mild RLQ tenderness, no rebound or guarding, bowel sounds noted throughout abdomen GU: Right Cva tenderness NEURO: Pt is awake/alert/appropriate, moves all extremitiesx4.  No facial droop.   EXTREMITIES: pulses normal/equal, full ROM SKIN: warm, color normal PSYCH:  no abnormalities of mood noted, alert and oriented to situation  ED Treatments / Results  Labs (all labs ordered are listed, but only abnormal results are displayed) Labs Reviewed  BASIC METABOLIC PANEL - Abnormal; Notable for the following components:      Result Value   Glucose, Bld 236 (*)    Creatinine, Ser 1.82 (*)    GFR calc non Af Amer 38 (*)    GFR calc Af Amer 44 (*)    All other components  within normal limits  CBC WITH DIFFERENTIAL/PLATELET - Abnormal; Notable for the following components:   WBC 10.6 (*)    Neutro Abs 8.0 (*)    Monocytes Absolute 1.2 (*)    All other components within normal limits  URINALYSIS, ROUTINE W REFLEX MICROSCOPIC - Abnormal; Notable for the following components:   Glucose, UA >=500 (*)    All other components within normal limits  URINE CULTURE    EKG None  Radiology Ct Renal Stone Study  Result Date: 11/30/2017 CLINICAL DATA:  Right-sided abdominal pain beginning 6 hours ago history of previous kidney stones. EXAM: CT ABDOMEN AND PELVIS WITHOUT CONTRAST TECHNIQUE: Multidetector CT imaging of the abdomen and pelvis was performed following the standard protocol without IV contrast. COMPARISON:  08/03/2014 FINDINGS: Lower chest: Chronic pleural and parenchymal scarring at the left base. Hepatobiliary: Chronic 9 mm cyst in the ventral liver. Diffuse fatty change of the liver. Tiny wall calcification of the gallbladder. No calcified gallstones are seen. Pancreas: Normal Spleen: Normal.  Scattered granulomas. Adrenals/Urinary Tract: Adrenal glands are normal. Left kidney contains multiple nonobstructing calculi, the largest measuring 6 mm in the lower pole. No sign of passing stone on the left. On the right, there is renal swelling with surrounding edema. There are nonobstructing renal calculi, the largest in the lower pole measuring 7 mm. There is hydroureteronephrosis with the ureter being dilated to the level just proximal to the UVJ. There is an obstructing 3 mm stone in this location. No stone in the bladder. Stomach/Bowel: Normal except for sigmoid diverticulosis. Vascular/Lymphatic: Aortic atherosclerosis. No aneurysm. IVC is normal. No retroperitoneal adenopathy. Reproductive: Normal Other: No free fluid or air. Musculoskeletal: Ordinary spondylosis. IMPRESSION: Hydroureteronephrosis on the right due to a 3 mm stone just proximal to the UVJ. Several  nonobstructing renal calculi in each kidney. Diffuse fatty change of the liver. 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 Aortic atherosclerosis. Electronically Signed   By: Paulina FusiMark  Shogry M.D.   On: 11/30/2017 09:15    Procedures Procedures  Medications Ordered in ED Medications  ondansetron (ZOFRAN) injection 4 mg (4 mg Intravenous Given 12/01/17 0417)  fentaNYL (SUBLIMAZE) injection 100 mcg (100 mcg Intravenous Given 12/01/17 0417)  sodium chloride 0.9 % bolus 1,000 mL (0 mLs Intravenous Stopped 12/01/17 0517)  HYDROmorphone (DILAUDID) injection 1 mg (1 mg Intravenous Given 12/01/17 0524)  HYDROmorphone (DILAUDID) injection 1 mg (1 mg Intravenous Given 12/01/17 0620)     Initial Impression / Assessment and Plan / ED Course  I have reviewed the triage vital signs and the nursing notes.  Pertinent labs results that were available during my care of the patient were reviewed by me and considered in my medical decision making (see chart for details).     4:21 AM Patient presents with recurrent ureteral colic.  Labs have been ordered, will treat pain and reassess 6:31 AM Patient reports continued pain despite multiple pain medications.  Will avoid Toradol due to renal insufficiency.  I discussed the case  with Dr. Vernie Ammons with urology, patient will go to the operating room for definitive management.  Patient has been n.p.o. since last night. Final Clinical Impressions(s) / ED Diagnoses   Final diagnoses:  Ureteral colic  Ureterolithiasis    ED Discharge Orders    None       Zadie Rhine, MD 12/01/17 [phone]

## 2017-12-01 NOTE — Transfer of Care (Signed)
Immediate Anesthesia Transfer of Care Note  Patient: Max StareGregory Keith Black  Procedure(s) Performed: CYSTOSCOPY/RIGHT RETROGRADE/RIGHT URETEROSCOPY/STONE EXTRACTION WITH BASKET (Right Ureter)  Patient Location: PACU  Anesthesia Type:General  Level of Consciousness: drowsy  Airway & Oxygen Therapy: Patient Spontanous Breathing and Patient connected to face mask  Post-op Assessment: Report given to RN and Post -op Vital signs reviewed and stable  Post vital signs: Reviewed and stable  Last Vitals:  Vitals Value Taken Time  BP 123/75 12/01/2017  8:40 AM  Temp    Pulse 77 12/01/2017  8:42 AM  Resp 12 12/01/2017  8:42 AM  SpO2 96 % 12/01/2017  8:42 AM  Vitals shown include unvalidated device data.  Last Pain:  Vitals:   12/01/17 0721  TempSrc:   PainSc: 6          Complications: No apparent anesthesia complications

## 2017-12-01 NOTE — Anesthesia Preprocedure Evaluation (Signed)
Anesthesia Evaluation  Patient identified by MRN, date of birth, ID band Patient awake    Reviewed: Allergy & Precautions, NPO status , Patient's Chart, lab work & pertinent test results  Airway Mallampati: II  TM Distance: >3 FB Neck ROM: Full    Dental  (+) Dental Advisory Given   Pulmonary neg pulmonary ROS,    breath sounds clear to auscultation       Cardiovascular negative cardio ROS   Rhythm:Regular Rate:Normal     Neuro/Psych negative neurological ROS     GI/Hepatic negative GI ROS, Neg liver ROS,   Endo/Other  diabetes, Type 2, Oral Hypoglycemic Agents  Renal/GU ARFRenal disease     Musculoskeletal   Abdominal   Peds  Hematology negative hematology ROS (+)   Anesthesia Other Findings   Reproductive/Obstetrics                             Lab Results  Component Value Date   WBC 10.6 (H) 12/01/2017   HGB 14.2 12/01/2017   HCT 40.1 12/01/2017   MCV 88.1 12/01/2017   PLT 203 12/01/2017   Lab Results  Component Value Date   CREATININE 1.82 (H) 12/01/2017   BUN 23 12/01/2017   NA 135 12/01/2017   K 4.1 12/01/2017   CL 99 12/01/2017   CO2 27 12/01/2017    Anesthesia Physical Anesthesia Plan  ASA: II  Anesthesia Plan: General   Post-op Pain Management:    Induction: Intravenous  PONV Risk Score and Plan: 2 and Ondansetron, Dexamethasone and Treatment may vary due to age or medical condition  Airway Management Planned: LMA and Oral ETT  Additional Equipment:   Intra-op Plan:   Post-operative Plan: Extubation in OR  Informed Consent: I have reviewed the patients History and Physical, chart, labs and discussed the procedure including the risks, benefits and alternatives for the proposed anesthesia with the patient or authorized representative who has indicated his/her understanding and acceptance.   Dental advisory given  Plan Discussed with: CRNA  Anesthesia  Plan Comments:         Anesthesia Quick Evaluation

## 2017-12-01 NOTE — ED Notes (Signed)
ED Provider at bedside. 

## 2017-12-01 NOTE — ED Notes (Signed)
In to give pain meds, pt standing at the bedside in pain, then said the pain subsided and he want to hold off on the meds. Aware to notify RN if pain returns

## 2017-12-01 NOTE — H&P (Signed)
Chief Complaint Patient presents with . Flank Pain   HPI Max Black is a 62 y.o. male.  The history is provided by the patient and the spouse.  Flank Pain  This is a recurrent problem. The problem occurs constantly. The problem has been rapidly worsening. Associated symptoms include abdominal pain. Pertinent negatives include no chest pain and no shortness of breath. Nothing aggravates the symptoms. Nothing relieves the symptoms.   Patient recently diagnosed with a right ureteral stone, which was improved in the ER.  He went home, but the pain has now returned.  No fever/vomiting He reports low back pain as well as abdominal pain. Reports previous history of kidney stones but denies any procedures.  He has known bilateral renal calculi.  His CT scan yesterday revealed a 3 mm stone in the distal right ureter and he was having renal colic that was controlled but he has returned in less than 24 hours with recurrent severe right flank pain.  He has not seen his stone pass.  He said he thought it may have dropped into the bladder yesterday.  Past Medical History: Diagnosis Date . Diabetes mellitus  . Kidney stone    Patient Active Problem List  Diagnosis Date Noted . Chest pain 09/23/2010 . Palpitations 09/23/2010   History reviewed. No pertinent surgical history.             Home Medications              Prior to Admission medications  Medication Sig Start Date End Date Taking? Authorizing Provider b complex vitamins capsule Take 1 capsule by mouth daily.    [provider] cholecalciferol (VITAMIN D) 1000 units tablet Take 2,000 Units by mouth daily.    [provider] HYDROcodone-acetaminophen (NORCO/VICODIN) 5-325 MG tablet Take 1 tablet by mouth every 6 (six) hours as needed for moderate pain. 11/30/17   Lawyer, Cristal Deer, PA-C loratadine (CLARITIN) 10 MG tablet Take 10 mg by mouth daily.    [provider] metFORMIN (GLUCOPHAGE-XR) 500 MG 24 hr tablet Take 500 mg by mouth every evening.    [provider] pravastatin (PRAVACHOL) 20 MG tablet Take 20 mg by mouth as directed. Patient is taking this twice weekly on no certain day of the week    [provider]   Family History No family history on file.  Social History Social History  Tobacco Use . Smoking status: Never Smoker Substance Use Topics . Alcohol use: No . Drug use: No    Allergies             Patient has no active allergies.   Review of Systems Review of Systems  Constitutional: Negative for fever.  Respiratory: Negative for shortness of breath.   Cardiovascular: Negative for chest pain.  Gastrointestinal: Positive for abdominal pain and nausea. Negative for vomiting.  Genitourinary: Positive for flank pain. Negative for testicular pain.  All other systems reviewed and are negative.    Physical Exam Updated Vital Signs BP (!) 147/84 (BP Location: Right Arm)   Pulse 71   Temp 98.2 F (36.8 C) (Oral)   Resp 16   Ht 1.905 m (6\' 3" )   Wt 103 kg   SpO2 97%   BMI 28.37 kg/m   Physical Exam  CONSTITUTIONAL: Well developed/well nourished, uncomfortable appearing HEAD: Normocephalic/atraumatic EYES: EOMI/PERRL ENMT: Mucous membranes moist NECK: supple no meningeal signs SPINE/BACK:entire spine nontender CV: S1/S2 noted, no murmurs/rubs/gallops noted LUNGS: Lungs are clear to auscultation bilaterally, no  apparent distress ABDOMEN: soft, mild RLQ tenderness, no rebound or guarding, bowel sounds noted throughout abdomen GU: Right Cva tenderness NEURO: Pt is awake/alert/appropriate, moves all extremitiesx4.  No facial droop.   EXTREMITIES: pulses normal/equal, full ROM SKIN: warm, color normal PSYCH: no abnormalities of mood noted, alert and oriented to situation  ED Treatments / Results Labs (all labs ordered are listed, but only abnormal results are  displayed) Labs Reviewed BASIC METABOLIC PANEL - Abnormal; Notable for the following components:     Result Value   Glucose, Bld 236 (*)   Creatinine, Ser 1.82 (*)   GFR calc non Af Amer 38 (*)   GFR calc Af Amer 44 (*)   All other components within normal limits CBC WITH DIFFERENTIAL/PLATELET - Abnormal; Notable for the following components:  WBC 10.6 (*)   Neutro Abs 8.0 (*)   Monocytes Absolute 1.2 (*)   All other components within normal limits URINALYSIS, ROUTINE W REFLEX MICROSCOPIC - Abnormal; Notable for the following components:  Glucose, UA >=500 (*)   All other components within normal limits URINE CULTURE   EKG None  Radiology  ImagingResults(Last48hours) Ct Renal Stone Study  Result Date: 11/30/2017 CLINICAL DATA:  Right-sided abdominal pain beginning 6 hours ago history of previous kidney stones. EXAM: CT ABDOMEN AND PELVIS WITHOUT CONTRAST TECHNIQUE: Multidetector CT imaging of the abdomen and pelvis was performed following the standard protocol without IV contrast. COMPARISON:  08/03/2014 FINDINGS: Lower chest: Chronic pleural and parenchymal scarring at the left base. Hepatobiliary: Chronic 9 mm cyst in the ventral liver. Diffuse fatty change of the liver. Tiny wall calcification of the gallbladder. No calcified gallstones are seen. Pancreas: Normal Spleen: Normal.  Scattered granulomas. Adrenals/Urinary Tract: Adrenal glands are normal. Left kidney contains multiple nonobstructing calculi, the largest measuring 6 mm in the lower pole. No sign of passing stone on the left. On the right, there is renal swelling with surrounding edema. There are nonobstructing renal calculi, the largest in the lower pole measuring 7 mm. There is hydroureteronephrosis with the ureter being dilated to the level just proximal to the UVJ. There is an obstructing 3 mm stone in this location. No stone in the bladder. Stomach/Bowel: Normal except for sigmoid  diverticulosis. Vascular/Lymphatic: Aortic atherosclerosis. No aneurysm. IVC is normal. No retroperitoneal adenopathy. Reproductive: Normal Other: No free fluid or air. Musculoskeletal: Ordinary spondylosis. IMPRESSION: Hydroureteronephrosis on the right due to a 3 mm stone just proximal to the UVJ. Several nonobstructing renal calculi in each kidney. Diffuse fatty change of the liver. 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 Aortic atherosclerosis. Electronically Signed   By: Paulina FusiMark  Shogry M.D.   On: 11/30/2017 09:15    Procedures Procedures  Medications Ordered in ED Medications ondansetron (ZOFRAN) injection 4 mg (4 mg Intravenous Given 12/01/17 0417) fentaNYL (SUBLIMAZE) injection 100 mcg (100 mcg Intravenous Given 12/01/17 0417) sodium chloride 0.9 % bolus 1,000 mL (0 mLs Intravenous Stopped 12/01/17 0517) HYDROmorphone (DILAUDID) injection 1 mg (1 mg Intravenous Given 12/01/17 0524) HYDROmorphone (DILAUDID) injection 1 mg (1 mg Intravenous Given 12/01/17 0620)   Assessment and Plan:   He had a 3 mm distal right ureteral stone by CT scan yesterday and was in the emergency room with renal colic which was eventually controlled however he has returned with severe right renal colic once again requiring parenteral narcotics for control and therefore we discussed the options for management including continued observation with medical expulsive therapy versus proceeding with ureteroscopic management today.  I told him that  because of the fact he has been in the emergency room twice now in 24 hours I would tend to recommend ureteroscopic management and he is in agreement.  I therefore went over the procedure with him in detail.  We discussed the risks and complications, the probability of success, the outpatient nature of the procedure as well as the anticipated postoperative course.  He understands the possible need for stent.  Having had all of his questions answered to his satisfaction he has elected to  proceed with right ureteroscopy and stone extraction with possible laser lithotripsy and stent placement.

## 2017-12-01 NOTE — Anesthesia Procedure Notes (Signed)
Procedure Name: LMA Insertion Date/Time: 12/01/2017 8:09 AM Performed by: Vanessa Durhamochran, Taelor Waymire Glenn, CRNA Pre-anesthesia Checklist: Emergency Drugs available, Patient identified, Suction available and Patient being monitored Patient Re-evaluated:Patient Re-evaluated prior to induction Oxygen Delivery Method: Circle system utilized Preoxygenation: Pre-oxygenation with 100% oxygen Induction Type: IV induction Ventilation: Mask ventilation without difficulty LMA: LMA inserted LMA Size: 5.0 Number of attempts: 1 Placement Confirmation: positive ETCO2 and breath sounds checked- equal and bilateral Tube secured with: Tape Dental Injury: Teeth and Oropharynx as per pre-operative assessment

## 2017-12-01 NOTE — ED Notes (Signed)
Pt to OR.

## 2017-12-01 NOTE — ED Notes (Signed)
Bed: WA03 Expected date:  Expected time:  Means of arrival:  Comments: 

## 2017-12-01 NOTE — Discharge Instructions (Signed)

## 2017-12-02 ENCOUNTER — Encounter (HOSPITAL_COMMUNITY): Payer: Self-pay | Admitting: Urology

## 2017-12-02 LAB — URINE CULTURE: Culture: NO GROWTH

## 2017-12-05 DIAGNOSIS — N201 Calculus of ureter: Secondary | ICD-10-CM | POA: Diagnosis not present

## 2017-12-05 DIAGNOSIS — N200 Calculus of kidney: Secondary | ICD-10-CM | POA: Diagnosis not present

## 2017-12-23 DIAGNOSIS — J019 Acute sinusitis, unspecified: Secondary | ICD-10-CM | POA: Diagnosis not present

## 2017-12-25 DIAGNOSIS — J069 Acute upper respiratory infection, unspecified: Secondary | ICD-10-CM | POA: Diagnosis not present

## 2017-12-28 ENCOUNTER — Other Ambulatory Visit: Payer: Self-pay

## 2017-12-28 ENCOUNTER — Encounter (HOSPITAL_COMMUNITY): Payer: Self-pay | Admitting: *Deleted

## 2017-12-28 ENCOUNTER — Emergency Department (HOSPITAL_COMMUNITY)
Admission: EM | Admit: 2017-12-28 | Discharge: 2017-12-28 | Disposition: A | Discharge status: Home / Self Care | Payer: BLUE CROSS/BLUE SHIELD | Attending: Emergency Medicine | Admitting: Emergency Medicine

## 2017-12-28 ENCOUNTER — Emergency Department (HOSPITAL_COMMUNITY): Payer: BLUE CROSS/BLUE SHIELD

## 2017-12-28 DIAGNOSIS — Z7984 Long term (current) use of oral hypoglycemic drugs: Secondary | ICD-10-CM | POA: Insufficient documentation

## 2017-12-28 DIAGNOSIS — E119 Type 2 diabetes mellitus without complications: Secondary | ICD-10-CM | POA: Diagnosis not present

## 2017-12-28 DIAGNOSIS — Z79899 Other long term (current) drug therapy: Secondary | ICD-10-CM | POA: Diagnosis not present

## 2017-12-28 DIAGNOSIS — R51 Headache: Secondary | ICD-10-CM | POA: Diagnosis not present

## 2017-12-28 DIAGNOSIS — J019 Acute sinusitis, unspecified: Secondary | ICD-10-CM | POA: Insufficient documentation

## 2017-12-28 MED ORDER — IBUPROFEN 800 MG PO TABS
800.0000 mg | ORAL_TABLET | Freq: Once | ORAL | Status: AC
  Administered 2017-12-28: 800 mg via ORAL
  Filled 2017-12-28: qty 1

## 2017-12-28 MED ORDER — AMOXICILLIN-POT CLAVULANATE 875-125 MG PO TABS
1.0000 | ORAL_TABLET | Freq: Once | ORAL | Status: AC
  Administered 2017-12-28: 1 via ORAL
  Filled 2017-12-28: qty 1

## 2017-12-28 MED ORDER — ACETAMINOPHEN 500 MG PO TABS
1000.0000 mg | ORAL_TABLET | Freq: Once | ORAL | Status: AC
  Administered 2017-12-28: 1000 mg via ORAL
  Filled 2017-12-28: qty 2

## 2017-12-28 MED ORDER — AMOXICILLIN-POT CLAVULANATE 875-125 MG PO TABS: 1.0000 | ORAL_TABLET | Freq: Two times a day (BID) | ORAL | 0 refills | Status: DC

## 2017-12-28 MED ORDER — FLUTICASONE PROPIONATE 50 MCG/ACT NA SUSP: 2.0000 | Freq: Every day | NASAL | 0 refills | Status: AC

## 2017-12-28 NOTE — ED Triage Notes (Signed)
Pt reports he is having increasing sinus pressure on the left side of his face, ear pain, and not feeling well. He has recently been seen by his doctor and has competed abx therapy but not feeling better. Unsure of fevers.

## 2017-12-28 NOTE — ED Provider Notes (Signed)
[facility]-EMERGENCY DEPT Provider Note   CSN: [id number] Arrival date & time: 12/28/17  4098     History   Chief Complaint Chief Complaint  Patient presents with  . Facial Pain    HPI Max Black is a 62 y.o. male.  The history is provided by the patient.  URI   This is a new problem. The current episode started more than 2 days ago. The problem has not changed since onset.There has been no fever. Associated symptoms include congestion, ear pain and plugged ear sensation. Pertinent negatives include no chest pain, no diarrhea, no nausea, no sneezing, no sore throat, no swollen glands, no joint pain, no joint swelling, no neck pain, no cough, no rash and no wheezing. Associated symptoms comments: Forehead pain. Treatments tried: azithromycin completed yesterday and afrin.   The treatment provided no relief.  No f/c/r.  No changes in vision nor speech.  No confusion.  No eye redness nor pain.  No nasal discharge.  No sore throat or neck pain.  No pain in the temples.  Has take mucinex as well but no tylenol nor ibuprofen.    Past Medical History:  Diagnosis Date  . Diabetes mellitus   . Kidney stone     Patient Active Problem List   Diagnosis Date Noted  . Chest pain 09/23/2010  . Palpitations 09/23/2010    Past Surgical History:  Procedure Laterality Date  . CYSTOSCOPY/RETROGRADE/URETEROSCOPY/STONE EXTRACTION WITH BASKET Right 12/01/2017   Procedure: CYSTOSCOPY/RIGHT RETROGRADE/RIGHT URETEROSCOPY/STONE EXTRACTION WITH BASKET;  Surgeon: Ihor Gully, MD;  Location: [facility];  Service: Urology;  Laterality: Right;        Home Medications    Prior to Admission medications   Medication Sig Start Date End Date Taking? Authorizing Provider  b complex vitamins capsule Take 1 capsule by mouth daily.    [provider]  cholecalciferol (VITAMIN D) 1000 units tablet Take 2,000 Units by mouth daily.    [provider]    HYDROcodone-acetaminophen (NORCO/VICODIN) 5-325 MG tablet Take 1 tablet by mouth every 6 (six) hours as needed for moderate pain. 11/30/17   Lawyer, Cristal Deer, PA-C  loratadine (CLARITIN) 10 MG tablet Take 10 mg by mouth daily.    [provider]  metFORMIN (GLUCOPHAGE-XR) 500 MG 24 hr tablet Take 500 mg by mouth every evening.    [provider]  phenazopyridine (PYRIDIUM) 200 MG tablet Take 1 tablet (200 mg total) by mouth 3 (three) times daily as needed for pain. 12/01/17   Ihor Gully, MD  pravastatin (PRAVACHOL) 20 MG tablet Take 20 mg by mouth as directed. Patient is taking this twice weekly on no certain day of the week    [provider]    Family History No family history on file.  Social History Social History   Tobacco Use  . Smoking status: Never Smoker  Substance Use Topics  . Alcohol use: No  . Drug use: No     Allergies   Metformin and related and Sulfa antibiotics   Review of Systems Review of Systems  Constitutional: Negative for appetite change, diaphoresis and fever.  HENT: Positive for congestion and ear pain. Negative for drooling, ear discharge, facial swelling, mouth sores, nosebleeds, sneezing, sore throat, tinnitus and voice change.   Eyes: Negative for photophobia, pain, discharge, redness, itching and visual disturbance.  Respiratory: Negative for cough and wheezing.   Cardiovascular: Negative for chest pain.  Gastrointestinal: Negative for diarrhea and nausea.  Musculoskeletal: Negative for  joint pain, neck pain and neck stiffness.  Skin: Negative for rash.  Neurological: Negative for dizziness, seizures, syncope, facial asymmetry, speech difficulty, weakness, light-headedness and numbness.  Psychiatric/Behavioral: Negative for confusion and decreased concentration.  All other systems reviewed and are negative.    Physical Exam Updated Vital Signs BP (!) 158/83 (BP Location: Left Arm)   Pulse 93   Temp 97.7 F  (36.5 C) (Oral)   Resp 15   Ht 6\' 3"  (1.905 m)   Wt 100.2 kg   SpO2 99%   BMI 27.62 kg/m   Physical Exam  Constitutional: He is oriented to person, place, and time. He appears well-developed and well-nourished. No distress.  HENT:  Head: Normocephalic and atraumatic.  Right Ear: External ear normal. No mastoid tenderness. Tympanic membrane is not injected, not scarred, not perforated, not erythematous, not retracted and not bulging.  Left Ear: External ear normal. No mastoid tenderness. Tympanic membrane is retracted. Tympanic membrane is not injected, not scarred, not perforated, not erythematous and not bulging.  Nose: Nose normal.  Mouth/Throat: Oropharynx is clear and moist. No oropharyngeal exudate.  No pain nor bulging over the temporal artery  Eyes: Pupils are equal, round, and reactive to light. Conjunctivae and EOM are normal.  No proptosis, disk margins sharp B, no injection.  Intact cognition  Neck: Normal range of motion. Neck supple.  Cardiovascular: Normal rate, regular rhythm, normal heart sounds and intact distal pulses.  Pulmonary/Chest: Effort normal and breath sounds normal. No stridor. He has no wheezes. He has no rales.  Abdominal: Soft. Bowel sounds are normal. He exhibits no mass. There is no tenderness. There is no rebound and no guarding.  Musculoskeletal: Normal range of motion.  Neurological: He is alert and oriented to person, place, and time. He displays normal reflexes. No cranial nerve deficit.  Skin: Skin is warm and dry. Capillary refill takes less than 2 seconds.  Psychiatric: He has a normal mood and affect.     ED Treatments / Results  Labs (all labs ordered are listed, but only abnormal results are displayed) Labs Reviewed - No data to display  EKG None  Radiology No results found.  Procedures Procedures (including critical care time)  Medications Ordered in ED Medications  ibuprofen (ADVIL,MOTRIN) tablet 800 mg (800 mg Oral Given  12/28/17 0537)  acetaminophen (TYLENOL) tablet 1,000 mg (1,000 mg Oral Given 12/28/17 0537)       Final Clinical Impressions(s) / ED Diagnoses  Will treat with augmentin and flonase.  Close follow up with your PMD.   Return for weakness, numbness, changes in vision or speech, fevers >100.4 unrelieved by medication, shortness of breath, intractable vomiting, or diarrhea, abdominal pain, Inability to tolerate liquids or food, cough, altered mental status or any concerns. No signs of systemic illness or infection. The patient is nontoxic-appearing on exam and vital signs are within normal limits.    I have reviewed the triage vital signs and the nursing notes. Pertinent labs &imaging results that were available during my care of the patient were reviewed by me and considered in my medical decision making (see chart for details).  After history, exam, and medical workup I feel the patient has been appropriately medically screened and is safe for discharge home. Pertinent diagnoses were discussed with the patient. Patient was given return precautions.       Iva Posten, MD 12/28/17 [phone]

## 2018-01-02 DIAGNOSIS — Z79899 Other long term (current) drug therapy: Secondary | ICD-10-CM | POA: Diagnosis not present

## 2018-01-02 DIAGNOSIS — F900 Attention-deficit hyperactivity disorder, predominantly inattentive type: Secondary | ICD-10-CM | POA: Diagnosis not present

## 2018-01-02 DIAGNOSIS — E119 Type 2 diabetes mellitus without complications: Secondary | ICD-10-CM | POA: Diagnosis not present

## 2018-01-07 DIAGNOSIS — E1169 Type 2 diabetes mellitus with other specified complication: Secondary | ICD-10-CM | POA: Diagnosis not present

## 2018-01-07 DIAGNOSIS — E7800 Pure hypercholesterolemia, unspecified: Secondary | ICD-10-CM | POA: Diagnosis not present

## 2018-03-06 DIAGNOSIS — Z1283 Encounter for screening for malignant neoplasm of skin: Secondary | ICD-10-CM | POA: Diagnosis not present

## 2018-03-06 DIAGNOSIS — D225 Melanocytic nevi of trunk: Secondary | ICD-10-CM | POA: Diagnosis not present

## 2018-03-06 DIAGNOSIS — K130 Diseases of lips: Secondary | ICD-10-CM | POA: Diagnosis not present

## 2018-03-06 DIAGNOSIS — L820 Inflamed seborrheic keratosis: Secondary | ICD-10-CM | POA: Diagnosis not present

## 2018-03-06 DIAGNOSIS — X32XXXA Exposure to sunlight, initial encounter: Secondary | ICD-10-CM | POA: Diagnosis not present

## 2018-03-06 DIAGNOSIS — L570 Actinic keratosis: Secondary | ICD-10-CM | POA: Diagnosis not present

## 2018-04-22 DIAGNOSIS — Z79899 Other long term (current) drug therapy: Secondary | ICD-10-CM | POA: Diagnosis not present

## 2018-04-22 DIAGNOSIS — F902 Attention-deficit hyperactivity disorder, combined type: Secondary | ICD-10-CM | POA: Diagnosis not present

## 2018-04-22 DIAGNOSIS — F900 Attention-deficit hyperactivity disorder, predominantly inattentive type: Secondary | ICD-10-CM | POA: Diagnosis not present

## 2018-05-07 DIAGNOSIS — J209 Acute bronchitis, unspecified: Secondary | ICD-10-CM | POA: Diagnosis not present

## 2018-05-17 DIAGNOSIS — E1169 Type 2 diabetes mellitus with other specified complication: Secondary | ICD-10-CM | POA: Diagnosis not present

## 2018-05-17 DIAGNOSIS — Z0000 Encounter for general adult medical examination without abnormal findings: Secondary | ICD-10-CM | POA: Diagnosis not present

## 2018-05-20 DIAGNOSIS — E7800 Pure hypercholesterolemia, unspecified: Secondary | ICD-10-CM | POA: Diagnosis not present

## 2018-05-20 DIAGNOSIS — Z23 Encounter for immunization: Secondary | ICD-10-CM | POA: Diagnosis not present

## 2018-05-20 DIAGNOSIS — E1169 Type 2 diabetes mellitus with other specified complication: Secondary | ICD-10-CM | POA: Diagnosis not present

## 2018-05-20 DIAGNOSIS — Z0000 Encounter for general adult medical examination without abnormal findings: Secondary | ICD-10-CM | POA: Diagnosis not present

## 2018-05-20 DIAGNOSIS — G43109 Migraine with aura, not intractable, without status migrainosus: Secondary | ICD-10-CM | POA: Diagnosis not present

## 2018-06-10 DIAGNOSIS — N486 Induration penis plastica: Secondary | ICD-10-CM | POA: Diagnosis not present

## 2018-06-10 DIAGNOSIS — Z87442 Personal history of urinary calculi: Secondary | ICD-10-CM | POA: Diagnosis not present

## 2018-07-17 DIAGNOSIS — R109 Unspecified abdominal pain: Secondary | ICD-10-CM | POA: Diagnosis not present

## 2018-10-18 DIAGNOSIS — F900 Attention-deficit hyperactivity disorder, predominantly inattentive type: Secondary | ICD-10-CM | POA: Diagnosis not present

## 2018-11-25 DIAGNOSIS — E7800 Pure hypercholesterolemia, unspecified: Secondary | ICD-10-CM | POA: Diagnosis not present

## 2018-11-25 DIAGNOSIS — E1169 Type 2 diabetes mellitus with other specified complication: Secondary | ICD-10-CM | POA: Diagnosis not present

## 2018-11-25 DIAGNOSIS — Z0000 Encounter for general adult medical examination without abnormal findings: Secondary | ICD-10-CM | POA: Diagnosis not present

## 2018-11-27 DIAGNOSIS — E1169 Type 2 diabetes mellitus with other specified complication: Secondary | ICD-10-CM | POA: Diagnosis not present

## 2018-11-27 DIAGNOSIS — E7800 Pure hypercholesterolemia, unspecified: Secondary | ICD-10-CM | POA: Diagnosis not present

## 2019-05-02 DIAGNOSIS — F900 Attention-deficit hyperactivity disorder, predominantly inattentive type: Secondary | ICD-10-CM | POA: Diagnosis not present

## 2019-05-02 DIAGNOSIS — Z79899 Other long term (current) drug therapy: Secondary | ICD-10-CM | POA: Diagnosis not present

## 2019-06-02 DIAGNOSIS — Z0000 Encounter for general adult medical examination without abnormal findings: Secondary | ICD-10-CM | POA: Diagnosis not present

## 2019-06-02 DIAGNOSIS — E1169 Type 2 diabetes mellitus with other specified complication: Secondary | ICD-10-CM | POA: Diagnosis not present

## 2019-06-02 DIAGNOSIS — Z125 Encounter for screening for malignant neoplasm of prostate: Secondary | ICD-10-CM | POA: Diagnosis not present

## 2019-06-02 DIAGNOSIS — Z13220 Encounter for screening for lipoid disorders: Secondary | ICD-10-CM | POA: Diagnosis not present

## 2019-06-03 DIAGNOSIS — Z0000 Encounter for general adult medical examination without abnormal findings: Secondary | ICD-10-CM | POA: Diagnosis not present

## 2019-06-03 DIAGNOSIS — E1169 Type 2 diabetes mellitus with other specified complication: Secondary | ICD-10-CM | POA: Diagnosis not present

## 2019-06-03 DIAGNOSIS — E7800 Pure hypercholesterolemia, unspecified: Secondary | ICD-10-CM | POA: Diagnosis not present

## 2019-06-03 DIAGNOSIS — G43109 Migraine with aura, not intractable, without status migrainosus: Secondary | ICD-10-CM | POA: Diagnosis not present

## 2019-06-03 DIAGNOSIS — E559 Vitamin D deficiency, unspecified: Secondary | ICD-10-CM | POA: Diagnosis not present

## 2019-07-25 DIAGNOSIS — Z79899 Other long term (current) drug therapy: Secondary | ICD-10-CM | POA: Diagnosis not present

## 2019-07-25 DIAGNOSIS — F900 Attention-deficit hyperactivity disorder, predominantly inattentive type: Secondary | ICD-10-CM | POA: Diagnosis not present

## 2019-10-24 DIAGNOSIS — Z79899 Other long term (current) drug therapy: Secondary | ICD-10-CM | POA: Diagnosis not present

## 2019-10-24 DIAGNOSIS — F909 Attention-deficit hyperactivity disorder, unspecified type: Secondary | ICD-10-CM | POA: Diagnosis not present

## 2019-11-01 ENCOUNTER — Other Ambulatory Visit: Payer: Self-pay

## 2019-11-01 ENCOUNTER — Encounter (HOSPITAL_COMMUNITY): Payer: Self-pay | Admitting: Emergency Medicine

## 2019-11-01 DIAGNOSIS — R682 Dry mouth, unspecified: Secondary | ICD-10-CM | POA: Diagnosis not present

## 2019-11-01 DIAGNOSIS — G459 Transient cerebral ischemic attack, unspecified: Secondary | ICD-10-CM | POA: Insufficient documentation

## 2019-11-01 DIAGNOSIS — E119 Type 2 diabetes mellitus without complications: Secondary | ICD-10-CM | POA: Insufficient documentation

## 2019-11-01 DIAGNOSIS — G43909 Migraine, unspecified, not intractable, without status migrainosus: Secondary | ICD-10-CM | POA: Diagnosis not present

## 2019-11-01 DIAGNOSIS — Z79899 Other long term (current) drug therapy: Secondary | ICD-10-CM | POA: Insufficient documentation

## 2019-11-01 DIAGNOSIS — R200 Anesthesia of skin: Secondary | ICD-10-CM | POA: Diagnosis not present

## 2019-11-01 LAB — CBC
HCT: 45.5 % (ref 39.0–52.0)
Hemoglobin: 15.5 g/dL (ref 13.0–17.0)
MCH: 30.8 pg (ref 26.0–34.0)
MCHC: 34.1 g/dL (ref 30.0–36.0)
MCV: 90.5 fL (ref 80.0–100.0)
Platelets: 233 10*3/uL (ref 150–400)
RBC: 5.03 MIL/uL (ref 4.22–5.81)
RDW: 12.9 % (ref 11.5–15.5)
WBC: 8.4 10*3/uL (ref 4.0–10.5)
nRBC: 0 % (ref 0.0–0.2)

## 2019-11-01 MED ORDER — SODIUM CHLORIDE 0.9% FLUSH: 3.0000 mL | Freq: Once | INTRAVENOUS | Status: DC

## 2019-11-01 NOTE — ED Triage Notes (Signed)
Patient complaining of numbness of left side from head to two around 2045 today. Patient is a type 2 diabetic. Patient also complaining of a headache.

## 2019-11-01 NOTE — ED Notes (Signed)
Save blue tube in main lab °

## 2019-11-02 ENCOUNTER — Emergency Department (HOSPITAL_COMMUNITY): Payer: BC Managed Care – PPO

## 2019-11-02 ENCOUNTER — Emergency Department (HOSPITAL_COMMUNITY)
Admission: EM | Admit: 2019-11-02 | Discharge: 2019-11-02 | Disposition: A | Discharge status: Home / Self Care | Payer: BC Managed Care – PPO | Attending: Emergency Medicine | Admitting: Emergency Medicine

## 2019-11-02 ENCOUNTER — Encounter (HOSPITAL_COMMUNITY): Payer: Self-pay

## 2019-11-02 DIAGNOSIS — R200 Anesthesia of skin: Secondary | ICD-10-CM | POA: Diagnosis not present

## 2019-11-02 DIAGNOSIS — R079 Chest pain, unspecified: Secondary | ICD-10-CM | POA: Diagnosis not present

## 2019-11-02 DIAGNOSIS — G459 Transient cerebral ischemic attack, unspecified: Secondary | ICD-10-CM | POA: Diagnosis not present

## 2019-11-02 LAB — BASIC METABOLIC PANEL
Anion gap: 12 (ref 5–15)
BUN: 20 mg/dL (ref 8–23)
CO2: 27 mmol/L (ref 22–32)
Calcium: 9.6 mg/dL (ref 8.9–10.3)
Chloride: 99 mmol/L (ref 98–111)
Creatinine, Ser: 1.27 mg/dL — ABNORMAL HIGH (ref 0.61–1.24)
GFR calc Af Amer: >60 mL/min (ref >60)
GFR calc non Af Amer: 59 mL/min — ABNORMAL LOW (ref >60)
Glucose, Bld: 180 mg/dL — ABNORMAL HIGH (ref 70–99)
Potassium: 3.8 mmol/L (ref 3.5–5.1)
Sodium: 138 mmol/L (ref 135–145)

## 2019-11-02 LAB — URINALYSIS, ROUTINE W REFLEX MICROSCOPIC
Bacteria, UA: NONE SEEN
Bilirubin Urine: NEGATIVE
Glucose, UA: >=500 mg/dL — AB
Hgb urine dipstick: NEGATIVE
Ketones, ur: NEGATIVE mg/dL
Leukocytes,Ua: NEGATIVE
Nitrite: NEGATIVE
Protein, ur: NEGATIVE mg/dL
Specific Gravity, Urine: 1.028 (ref 1.005–1.030)
pH: 5 (ref 5.0–8.0)

## 2019-11-02 LAB — TROPONIN I (HIGH SENSITIVITY)
Troponin I (High Sensitivity): <2 ng/L (ref <18)
Troponin I (High Sensitivity): 2 ng/L (ref <18)

## 2019-11-02 MED ORDER — SODIUM CHLORIDE (PF) 0.9 % IJ SOLN
INTRAMUSCULAR | Status: AC
  Administered 2019-11-02: 1 mL
  Filled 2019-11-02: qty 50

## 2019-11-02 MED ORDER — IOHEXOL 350 MG/ML SOLN
100.0000 mL | Freq: Once | INTRAVENOUS | Status: AC | PRN
  Administered 2019-11-02: 100 mL via INTRAVENOUS

## 2019-11-02 NOTE — ED Provider Notes (Signed)
  Provider Note MRN:  [id number]  Arrival date & time: 11/02/19    ED Course and Medical Decision Making  Assumed care from Dr. Lynelle Doctor at shift change.  Considering TIA versus complex migraine, awaiting MRI.  If any abnormality would reconsult neurology.  MRI with no acute process, some mild white matter disease.  Discussed with Dr. Otelia Limes, no need for admission, appropriate for outpatient follow-up.  Procedures  Final Clinical Impressions(s) / ED Diagnoses     ICD-10-CM   1. Numbness  R20.0     ED Discharge Orders         Ordered    Ambulatory referral to Neurology     Discontinue  Reprint    Comments: An appointment is requested in approximately: 2 weeks   11/02/19 0918            Discharge Instructions     You were evaluated in the Emergency Department and after careful evaluation, we did not find any emergent condition requiring admission or further testing in the hospital.  Your exam/testing today was overall reassuring.  Your MRI did not show any signs of stroke.  We advise starting a daily 81 mg aspirin and follow-up with neurology.  Please return to the Emergency Department if you experience any worsening of your condition.  Thank you for allowing [place] to be a part of your care.      Elmer Sow. Pilar Plate, MD [facility] Emergency Medicine [facility] [email or web address]    Sabas Sous, MD 11/02/19 [phone]

## 2019-11-02 NOTE — Discharge Instructions (Addendum)
You were evaluated in the Emergency Department and after careful evaluation, we did not find any emergent condition requiring admission or further testing in the hospital.  Your exam/testing today was overall reassuring.  Your MRI did not show any signs of stroke.  We advise starting a daily 81 mg aspirin and follow-up with neurology.  Please return to the Emergency Department if you experience any worsening of your condition.  Thank you for allowing [place] to be a part of your care.

## 2019-11-02 NOTE — ED Provider Notes (Signed)
[facility]-EMERGENCY DEPT Provider Note   CSN: [id number] Arrival date & time: 11/01/19  2111   Time seen 4:15 AM  History Chief Complaint  Patient presents with  . Numbness    Max Black is a 64 y.o. male.  HPI   Patient states about 845 the evening of July 31 he had a sensation of numbness that started in his left leg went up his body in his left upper extremity and his left face and lasted about 15 seconds.  He then had double vision for about 30 minutes.  If he covered one eye the double vision resolved.  He then started having a dull mild left-sided headache that was in the left frontal area and the left side of his head that lasted a few hours but now is almost gone.  He states earlier today today he had his typical migraine headache which is preceded by an aura and was in the bilateral frontal area.  He did not have an aura before the events tonight.  He has never had this happen before.  He is unaware of any family history of strokes.  He denies any nausea or vomiting.  He did not note any motor weakness at that time.  His speech was normal and his wife examined him and he had normal facial movement.  Patient has a history of type 2 diabetes and high cholesterol which is controlled with medications.  He denies history of hypertension.  PCP Daisy Florooss, Charles Alan, MD   Past Medical History:  Diagnosis Date  . Diabetes mellitus   . Kidney stone     Patient Active Problem List   Diagnosis Date Noted  . Chest pain 09/23/2010  . Palpitations 09/23/2010    Past Surgical History:  Procedure Laterality Date  . CYSTOSCOPY/RETROGRADE/URETEROSCOPY/STONE EXTRACTION WITH BASKET Right 12/01/2017   Procedure: CYSTOSCOPY/RIGHT RETROGRADE/RIGHT URETEROSCOPY/STONE EXTRACTION WITH BASKET;  Surgeon: Ihor Gullyttelin, Mark, MD;  Location: [facility];  Service: Urology;  Laterality: Right;       History reviewed. No pertinent family history.  Social History   Tobacco Use   . Smoking status: Never Smoker  . Smokeless tobacco: Never Used  Vaping Use  . Vaping Use: Never used  Substance Use Topics  . Alcohol use: No  . Drug use: No  lives with spouse  Home Medications Prior to Admission medications   Medication Sig Start Date End Date Taking? Authorizing Provider  b complex vitamins capsule Take 1 capsule by mouth daily.   Yes [provider]  cholecalciferol (VITAMIN D) 1000 units tablet Take 2,000 Units by mouth daily.   Yes [provider]  FARXIGA 10 MG TABS tablet Take 10 mg by mouth daily. 10/18/19  Yes [provider]  loratadine (CLARITIN) 10 MG tablet Take 10 mg by mouth daily.   Yes [provider]  pioglitazone (ACTOS) 15 MG tablet Take 15 mg by mouth daily. 09/02/19  Yes [provider]  pravastatin (PRAVACHOL) 20 MG tablet Take 20 mg by mouth as directed. Patient is taking this twice weekly on no certain day of the week   Yes [provider]  fluticasone (FLONASE) 50 MCG/ACT nasal spray Place 2 sprays into both nostrils daily. 12/28/17   Palumbo, April, MD    Allergies    Metformin and related and Sulfa antibiotics  Review of Systems   Review of Systems  All other systems reviewed and are negative.   Physical Exam Updated Vital Signs BP (!) 135/84 (  BP Location: Left Arm)   Pulse 79   Temp (!) 97.4 F (36.3 C) (Oral)   Resp 18   Ht 6\' 3"  (1.905 m)   Wt (!) 98.9 kg   SpO2 100%   BMI 27.25 kg/m   Physical Exam Vitals and nursing note reviewed.  Constitutional:      General: He is not in acute distress.    Appearance: Normal appearance. He is normal weight.  HENT:     Head: Normocephalic and atraumatic.     Right Ear: External ear normal.     Left Ear: External ear normal.     Nose: Nose normal.     Mouth/Throat:     Mouth: Mucous membranes are dry.     Pharynx: No oropharyngeal exudate or posterior oropharyngeal erythema.  Eyes:     Extraocular Movements: Extraocular  movements intact.     Conjunctiva/sclera: Conjunctivae normal.     Pupils: Pupils are equal, round, and reactive to light.     Comments: When I test his extraocular muscles they appear to be intact, he denies having diplopia with range of motion.  Neck:     Vascular: No carotid bruit.  Cardiovascular:     Rate and Rhythm: Normal rate.     Pulses: Normal pulses.     Heart sounds: Normal heart sounds.  Pulmonary:     Effort: Pulmonary effort is normal. No respiratory distress.     Breath sounds: Normal breath sounds.  Musculoskeletal:        General: Normal range of motion.     Cervical back: Normal range of motion and neck supple.  Skin:    General: Skin is warm and dry.  Neurological:     General: No focal deficit present.     Mental Status: He is alert and oriented to person, place, and time.     Cranial Nerves: No cranial nerve deficit.     Comments: Grips are equal, there is no pronator drift, there is no apparent motor weakness of any of his extremities.  Psychiatric:        Mood and Affect: Mood normal.        Behavior: Behavior normal.        Thought Content: Thought content normal.     ED Results / Procedures / Treatments   Labs (all labs ordered are listed, but only abnormal results are displayed) Results for orders placed or performed during the hospital encounter of 11/02/19  Basic metabolic panel  Result Value Ref Range   Sodium 138 135 - 145 mmol/L   Potassium 3.8 3.5 - 5.1 mmol/L   Chloride 99 98 - 111 mmol/L   CO2 27 22 - 32 mmol/L   Glucose, Bld 180 (H) 70 - 99 mg/dL   BUN 20 8 - 23 mg/dL   Creatinine, Ser 01/02/20 (H) 0.61 - 1.24 mg/dL   Calcium 9.6 8.9 - 5.73 mg/dL   GFR calc non Af Amer 59 (L) >60 mL/min   GFR calc Af Amer >60 >60 mL/min   Anion gap 12 5 - 15  CBC  Result Value Ref Range   WBC 8.4 4.0 - 10.5 K/uL   RBC 5.03 4.22 - 5.81 MIL/uL   Hemoglobin 15.5 13.0 - 17.0 g/dL   HCT 22.0 39 - 52 %   MCV 90.5 80.0 - 100.0 fL   MCH 30.8 26.0 - 34.0 pg    MCHC 34.1 30.0 - 36.0 g/dL   RDW 25.4 27.0 - 62.3 %  Platelets 233 150 - 400 K/uL   nRBC 0.0 0.0 - 0.2 %  Urinalysis, Routine w reflex microscopic  Result Value Ref Range   Color, Urine YELLOW YELLOW   APPearance CLEAR CLEAR   Specific Gravity, Urine 1.028 1.005 - 1.030   pH 5.0 5.0 - 8.0   Glucose, UA >=500 (A) NEGATIVE mg/dL   Hgb urine dipstick NEGATIVE NEGATIVE   Bilirubin Urine NEGATIVE NEGATIVE   Ketones, ur NEGATIVE NEGATIVE mg/dL   Protein, ur NEGATIVE NEGATIVE mg/dL   Nitrite NEGATIVE NEGATIVE   Leukocytes,Ua NEGATIVE NEGATIVE   RBC / HPF 0-5 0 - 5 RBC/hpf   WBC, UA 0-5 0 - 5 WBC/hpf   Bacteria, UA NONE SEEN NONE SEEN  Troponin I (High Sensitivity)  Result Value Ref Range   Troponin I (High Sensitivity) <2 <18 ng/L  Troponin I (High Sensitivity)  Result Value Ref Range   Troponin I (High Sensitivity) 2 <18 ng/L   Laboratory interpretation all normal except hyperglycemia, glucosuria, mild renal insufficiency with GFR minimally depressed    EKG None  Radiology CT Angio Head W or Wo Contrast  CT Angio Neck W and/or Wo Contrast  Result Date: 11/02/2019 CLINICAL DATA:  Initial evaluation for acute left-sided numbness. EXAM: CT ANGIOGRAPHY HEAD AND NECK TECHNIQUE: Multidetector CT imaging of the head and neck was performed using the standard protocol during bolus administration of intravenous contrast. Multiplanar CT image reconstructions and MIPs were obtained to evaluate the vascular anatomy. Carotid stenosis measurements (when applicable) are obtained utilizing NASCET criteria, using the distal internal carotid diameter as the denominator. CONTRAST:  OMNIPAQUE IOHEXOL 350 MG/ML SOLN COMPARISON:  Prior study from 12/28/2017. FINDINGS: CT HEAD FINDINGS Brain: Cerebral volume within normal limits for patient age. No evidence for acute intracranial hemorrhage. No findings to suggest acute large vessel territory infarct. No mass lesion, midline shift, or mass effect.  Ventricles are normal in size without evidence for hydrocephalus. No extra-axial fluid collection identified. 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 Vascular: No hyperdense vessel identified. Skull: Scalp soft tissues demonstrate no acute abnormality. Calvarium intact. Sinuses/Orbits: Globes and orbital soft tissues within normal limits. Visualized paranasal sinuses are clear. No mastoid effusion. CTA NECK FINDINGS Aortic arch: Visualized aortic arch of normal caliber with normal branch pattern. Mild atheromatous change within the arch itself. No hemodynamically significant stenosis seen about the origin of the great vessels. Right carotid system: Right common and internal carotid arteries widely patent without stenosis, dissection or occlusion. Left carotid system: Left common and internal carotid arteries widely patent without stenosis, dissection or occlusion. Vertebral arteries: Both vertebral arteries arise from the subclavian arteries. Right vertebral artery dominant. Vertebral arteries widely patent within the neck without stenosis, dissection or occlusion. Skeleton: No acute osseous abnormality. No discrete or worrisome osseous lesions. Degenerative changes noted about the left TMJ. Other neck: No other acute soft tissue abnormality within the neck. No mass lesion or adenopathy. Upper chest: Visualized upper chest demonstrates no acute finding. 9 mm partially calcified nodule noted within the right upper lobe, nonspecific, but most likely benign given the calcifications. Partially calcified precarinal lymph node noted as well. Findings suggest prior granulomatous infection. Review of the MIP images confirms the above findings CTA HEAD FINDINGS Anterior circulation: Petrous, cavernous, and supraclinoid segments widely patent without stenosis or other abnormality. A1 segments widely patent. Normal anterior communicating artery complex. Anterior cerebral arteries widely  patent to their distal aspects without stenosis. No M1 stenosis or occlusion.  Normal MCA bifurcations. Distal MCA branches well perfused and symmetric. Posterior circulation: Vertebral arteries widely patent to the vertebrobasilar junction without stenosis. Both picas patent. Basilar widely patent to its distal aspect without stenosis. Superior cerebral arteries patent bilaterally. Right PCA supplied via the basilar. Fetal type origin left PCA. Both PCAs well perfused to their distal aspects without stenosis. Venous sinuses: Patent. Anatomic variants: Fetal type origin left PCA. No intracranial aneurysm. Review of the MIP images confirms the above findings IMPRESSION: CT HEAD IMPRESSION: Normal head CT.  No acute intracranial abnormality identified. CTA HEAD AND NECK IMPRESSION: Negative CTA of the head and neck. No large vessel occlusion, hemodynamically significant stenosis, or other acute vascular abnormality. No aneurysm. Electronically Signed   By: Rise Mu M.D.   On: 11/02/2019 06:11    Procedures Procedures (including critical care time)  Medications Ordered in ED Medications  sodium chloride (PF) 0.9 % injection (1 mL  Given by Other 11/02/19 0517)  iohexol (OMNIPAQUE) 350 MG/ML injection 100 mL (100 mLs Intravenous Contrast Given 11/02/19 5883)    ED Course  I have reviewed the triage vital signs and the nursing notes.  Pertinent labs & imaging results that were available during my care of the patient were reviewed by me and considered in my medical decision making (see chart for details).    MDM Rules/Calculators/A&P                           Patient symptoms are concerning for TIA, head CT and CTA of head and neck were performed.  We discussed neurology evaluation and probable need for MRI.   Patient CT and CTA does not show any obvious findings.  I'm going to talk to neurology about whether patient needs MRI and/or admission.  6:32 AM Dr. Jerrell Belfast, neurologist has  reviewed his CTA, he feels patient does need to get an MRI.  If the MRI is normal he can be discharged home to follow-up with outpatient neurology and he should start taking aspirin 81 mg a day.  If he has any abnormality he should be admitted by the hospitalist at [facility].  I have talked to the patient and he knows the plan. He states he doesn't need sedation for his MRI.   Dr Pilar Plate at change of shift will get the results of his MRI and make his disposition.   Final Clinical Impression(s) / ED Diagnoses Final diagnoses:  TIA (transient ischemic attack)    Rx / DC Orders  Disposition pending  Devoria Albe, MD, Concha Pyo, MD 11/02/19 [phone]

## 2019-11-02 NOTE — ED Notes (Signed)
Patient transported to MRI 

## 2019-11-04 DIAGNOSIS — M720 Palmar fascial fibromatosis [Dupuytren]: Secondary | ICD-10-CM | POA: Diagnosis not present

## 2019-11-06 ENCOUNTER — Other Ambulatory Visit: Payer: Self-pay

## 2019-11-06 ENCOUNTER — Encounter: Payer: Self-pay | Admitting: Neurology

## 2019-11-06 ENCOUNTER — Ambulatory Visit: Payer: BC Managed Care – PPO | Admitting: Neurology

## 2019-11-06 DIAGNOSIS — G43109 Migraine with aura, not intractable, without status migrainosus: Secondary | ICD-10-CM | POA: Diagnosis not present

## 2019-11-06 HISTORY — DX: Migraine with aura, not intractable, without status migrainosus: G43.109

## 2019-11-06 NOTE — Progress Notes (Signed)
Reason for visit: Transient left-sided sensory alteration  Referring physician: Gary  Max Black is a 64 y.o. male  History of present illness:  Max Black is a 64 year old right-handed white male with a history of migraine headaches since he was 64 years old.  The patient usually has a visual aura to the headache associated with a spot in the vision that gradually spreads to a larger circular pattern and then disappears within 5 to 15 minutes, then he may get a bifrontal headache that usually is mild but can be severe.  He generally does not get any nausea or vomiting or any photophobia or phonophobia.  He has not been able to recognize any activating factors for the headache.  The patient may have 2-3 headaches a month, he may take Aleve for the headache on occasion.  The patient went to the emergency room on 02 November 2019 when he had an unusual event.  He noted a typical headache earlier that day but later he had onset of an unusual sensation involving the left leg that gradually spread to the left arm and face of about 15 seconds or so, this disappeared and then he had onset of vertical double vision that lasted almost 1/2 an hour.  Later, he had a bifrontal headache.  The patient did not have definite weakness of the extremities, he had no alteration in speech, he did not have any confusion or syncope.  He denied any changes in balance.  He went to the emergency room for evaluation, MRI of the brain did not show any evidence of acute changes, CT angiogram of the head and neck were unremarkable.  The patient was told to go on low-dose aspirin and was discharged home.  He comes to this office for further evaluation.  He has not had any recurrence of the above event.  Past Medical History:  Diagnosis Date  . Diabetes mellitus   . Kidney stone     Past Surgical History:  Procedure Laterality Date  . CYSTOSCOPY/RETROGRADE/URETEROSCOPY/STONE EXTRACTION WITH BASKET Right 12/01/2017    Procedure: CYSTOSCOPY/RIGHT RETROGRADE/RIGHT URETEROSCOPY/STONE EXTRACTION WITH BASKET;  Surgeon: Ihor Gully, MD;  Location: [facility];  Service: Urology;  Laterality: Right;    No family history on file.  Social history:  reports that he has never smoked. He has never used smokeless tobacco. He reports that he does not drink alcohol and does not use drugs.  Medications:  Prior to Admission medications   Medication Sig Start Date End Date Taking? Authorizing Provider  b complex vitamins capsule Take 1 capsule by mouth daily.    [provider]  cholecalciferol (VITAMIN D) 1000 units tablet Take 2,000 Units by mouth daily.    [provider]  FARXIGA 10 MG TABS tablet Take 10 mg by mouth daily. 10/18/19   [provider]  fluticasone (FLONASE) 50 MCG/ACT nasal spray Place 2 sprays into both nostrils daily. 12/28/17   Palumbo, April, MD  loratadine (CLARITIN) 10 MG tablet Take 10 mg by mouth daily.    [provider]  pioglitazone (ACTOS) 15 MG tablet Take 15 mg by mouth daily. 09/02/19   [provider]  pravastatin (PRAVACHOL) 20 MG tablet Take 20 mg by mouth as directed. Patient is taking this twice weekly on no certain day of the week    [provider]      Allergies  Allergen Reactions  . Metformin And Related Other (See Comments)    hyperglycemia  .  Sulfa Antibiotics Other (See Comments)    ROS:  Out of a complete 14 system review of symptoms, the patient complains only of the following symptoms, and all other reviewed systems are negative.  Migraine headache Transient double vision, left sensory complaints  Height 6\' 3"  (1.905 m), weight 218 lb (98.9 kg).  Physical Exam  General: The patient is alert and cooperative at the time of the examination.  Eyes: Pupils are equal, round, and reactive to light. Discs are flat bilaterally.  Neck: The neck is supple, no carotid bruits are noted.  Respiratory: The respiratory  examination is clear.  Cardiovascular: The cardiovascular examination reveals a regular rate and rhythm, no obvious murmurs or rubs are noted.  Skin: Extremities are without significant edema.  Neurologic Exam  Mental status: The patient is alert and oriented x 3 at the time of the examination. The patient has apparent normal recent and remote memory, with an apparently normal attention span and concentration ability.  Cranial nerves: Facial symmetry is present. There is good sensation of the face to pinprick and soft touch bilaterally. The strength of the facial muscles and the muscles to head turning and shoulder shrug are normal bilaterally. Speech is well enunciated, no aphasia or dysarthria is noted. Extraocular movements are full. Visual fields are full. The tongue is midline, and the patient has symmetric elevation of the soft palate. No obvious hearing deficits are noted.  Motor: The motor testing reveals 5 over 5 strength of all 4 extremities. Good symmetric motor tone is noted throughout.  Sensory: Sensory testing is intact to pinprick, soft touch, vibration sensation, and position sense on all 4 extremities. No evidence of extinction is noted.  Coordination: Cerebellar testing reveals good finger-nose-finger and heel-to-shin bilaterally.  Gait and station: Gait is normal. Tandem gait is normal. Romberg is negative. No drift is seen.  Reflexes: Deep tendon reflexes are symmetric and normal bilaterally. Toes are downgoing bilaterally.   MRI brain 11/02/19:  IMPRESSION: No acute finding. Few small foci of T2 and FLAIR signal within the hemispheric white matter consistent with minimal small vessel change. 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  * MRI scan images were reviewed online. I agree with the written report.   CTA head and neck 11/02/19:  CT HEAD IMPRESSION:  Normal head CT.  No acute intracranial abnormality identified.  CTA HEAD  AND NECK IMPRESSION:  Negative CTA of the head and neck. No large vessel occlusion, hemodynamically significant stenosis, or other acute vascular abnormality. No aneurysm.    Assessment/Plan:  1.  Classic migraine headache  2.  Transient double vision, left-sided sensory complaints  The events that occurred on 02 November 2019 could have represented a migrainous episode, but this is unusual for the patient.  The patient should be on an aspirin tablet daily.  I would not initiate treatment for migraine currently, but if the headaches become more frequent or the episodes of left-sided sensory alteration double vision keep recurring, I may consider a daily preventative medication.  The patient will follow up through this office if needed.  04 November 2019 MD 11/06/2019 2:23 PM  [facility] [address] [place], [place]  Phone [phone] Fax [phone]

## 2019-12-17 DIAGNOSIS — Z1159 Encounter for screening for other viral diseases: Secondary | ICD-10-CM | POA: Diagnosis not present

## 2019-12-19 DIAGNOSIS — K5730 Diverticulosis of large intestine without perforation or abscess without bleeding: Secondary | ICD-10-CM | POA: Diagnosis not present

## 2019-12-19 DIAGNOSIS — D122 Benign neoplasm of ascending colon: Secondary | ICD-10-CM | POA: Diagnosis not present

## 2019-12-19 DIAGNOSIS — Z1211 Encounter for screening for malignant neoplasm of colon: Secondary | ICD-10-CM | POA: Diagnosis not present

## 2019-12-19 DIAGNOSIS — D123 Benign neoplasm of transverse colon: Secondary | ICD-10-CM | POA: Diagnosis not present

## 2019-12-25 DIAGNOSIS — E1169 Type 2 diabetes mellitus with other specified complication: Secondary | ICD-10-CM | POA: Diagnosis not present

## 2020-01-26 DIAGNOSIS — Z79899 Other long term (current) drug therapy: Secondary | ICD-10-CM | POA: Diagnosis not present

## 2020-01-26 DIAGNOSIS — F909 Attention-deficit hyperactivity disorder, unspecified type: Secondary | ICD-10-CM | POA: Diagnosis not present

## 2020-03-05 IMAGING — CT CT RENAL STONE PROTOCOL
2 of 4 series · 16 of 46 positions shown, 18 images · non-contrast
Comparison: 08/03/2014

CLINICAL DATA: Right-sided abdominal pain beginning 6 hours ago
history of previous kidney stones.

EXAM:
CT ABDOMEN AND PELVIS WITHOUT CONTRAST
TECHNIQUE: Multidetector CT imaging of the abdomen and pelvis was performed
following the standard protocol without IV contrast.

[Series 2: axial st · axial · 0.81mm/px · z∈[-494,-20]mm · 13 of 109 slices shown, 15 images]
[im 7/109  soft-tissue]
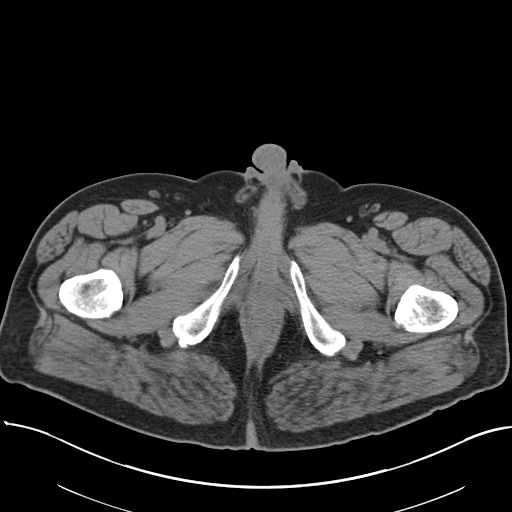
[im 7/109  bone]
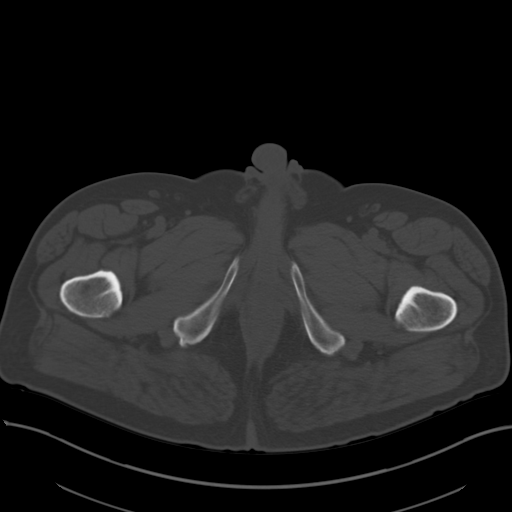
[im 14/109  soft-tissue]
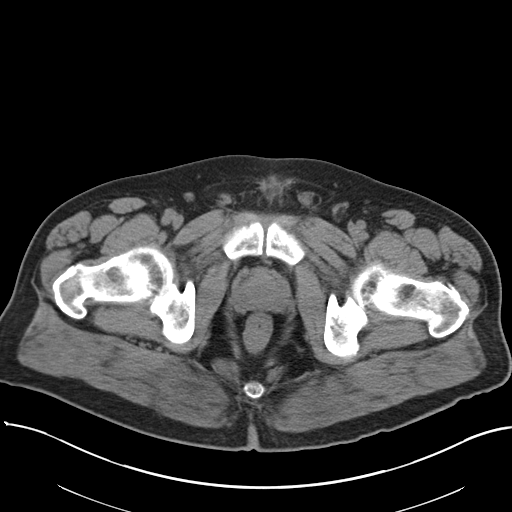
[im 21/109  soft-tissue]
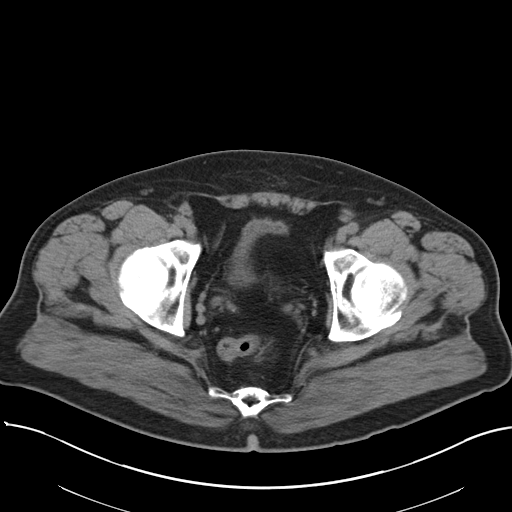
[im 34/109  soft-tissue]
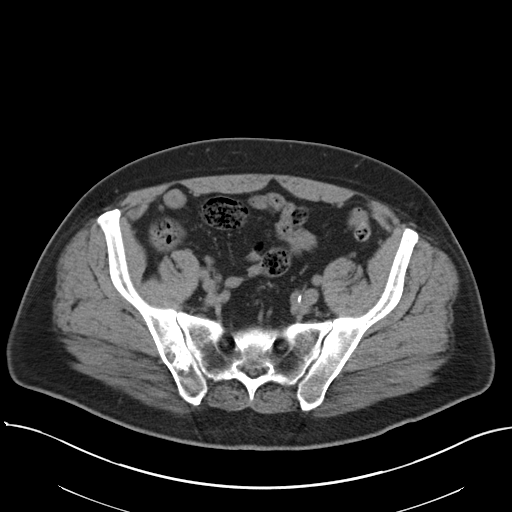
[im 41/109  soft-tissue]
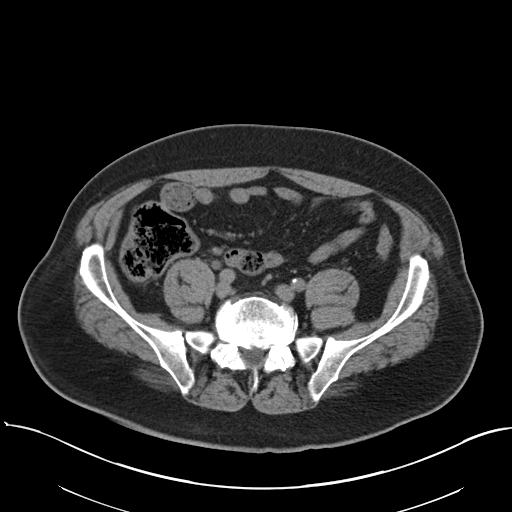
[im 48/109  soft-tissue]
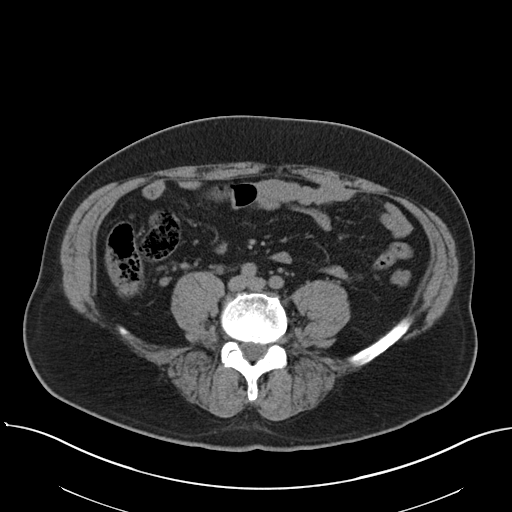
[im 55/109  soft-tissue]
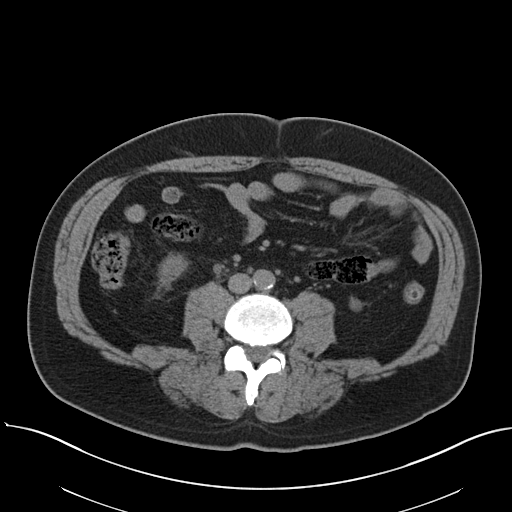
[im 61/109  soft-tissue]
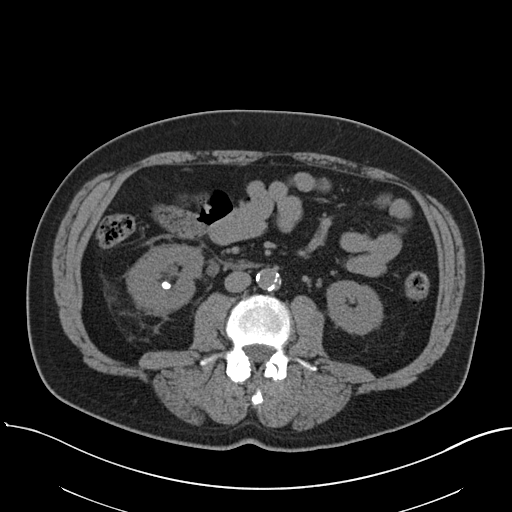
[im 68/109  soft-tissue]
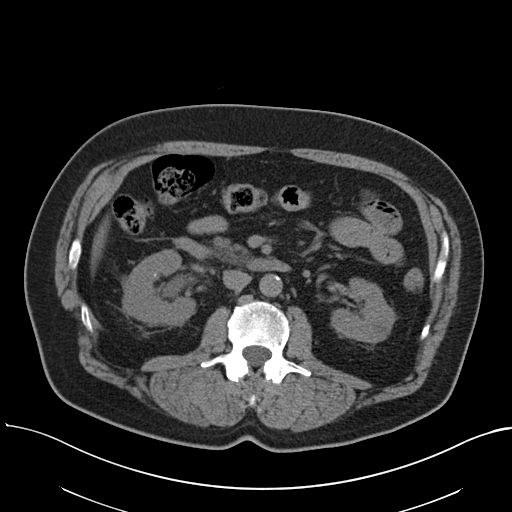
[im 68/109  bone]
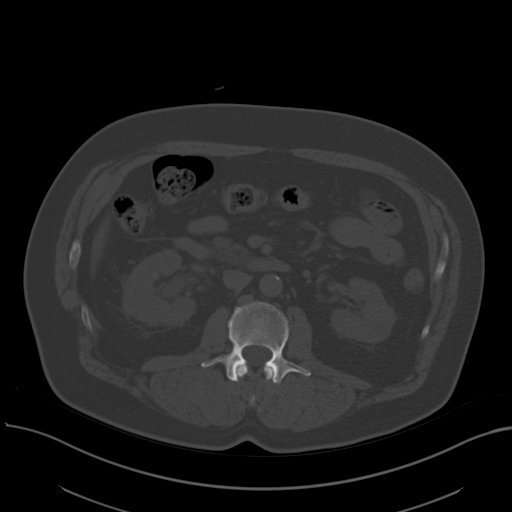
[im 75/109  soft-tissue]
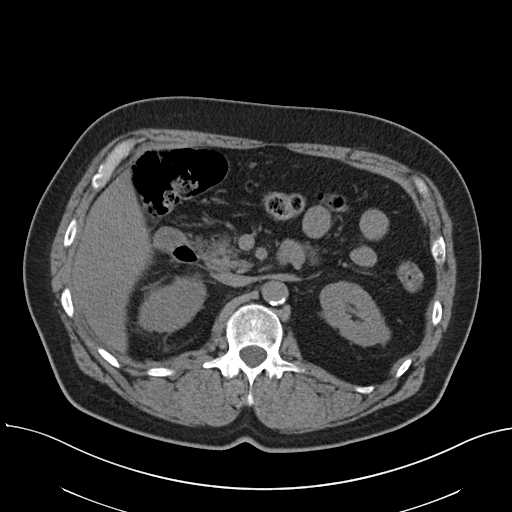
[im 88/109  soft-tissue]
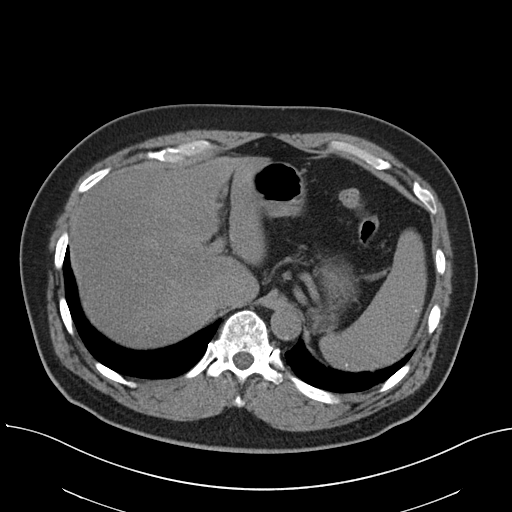
[im 95/109  soft-tissue]
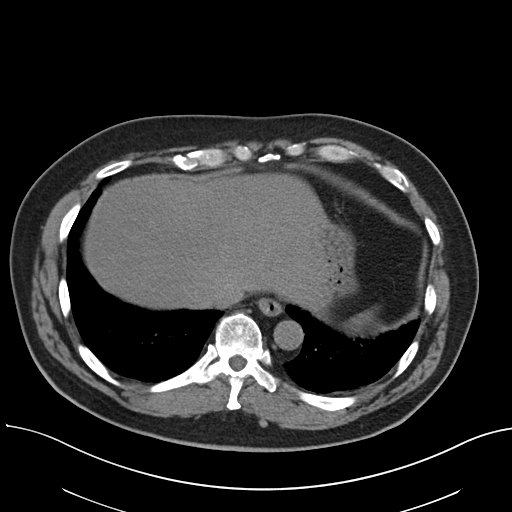
[im 102/109  soft-tissue]
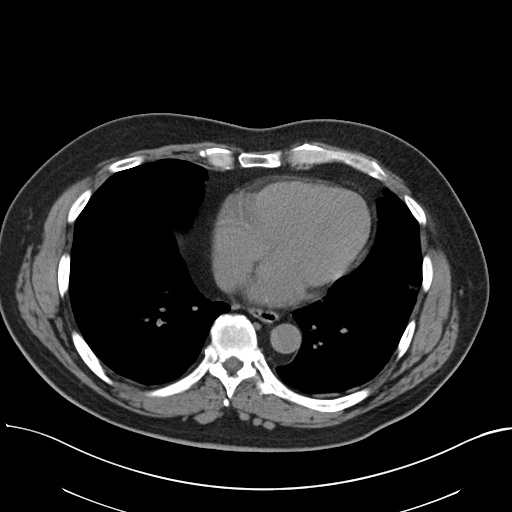

[Series 5: coronal · coronal · 0.91mm/px · 3 of 170 slices shown]
[im 57/170  soft-tissue]
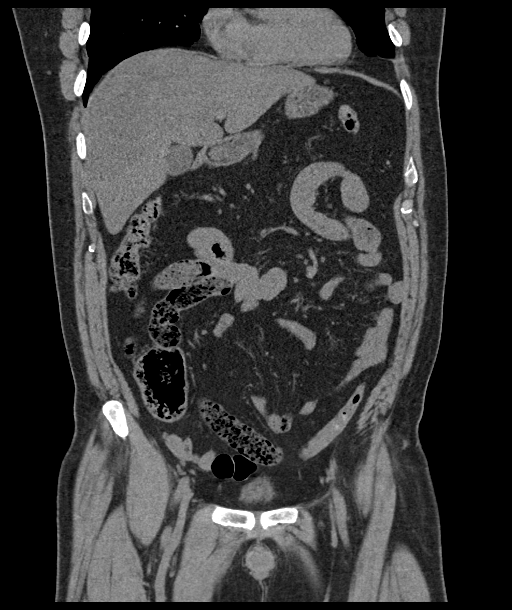
[im 76/170  soft-tissue]
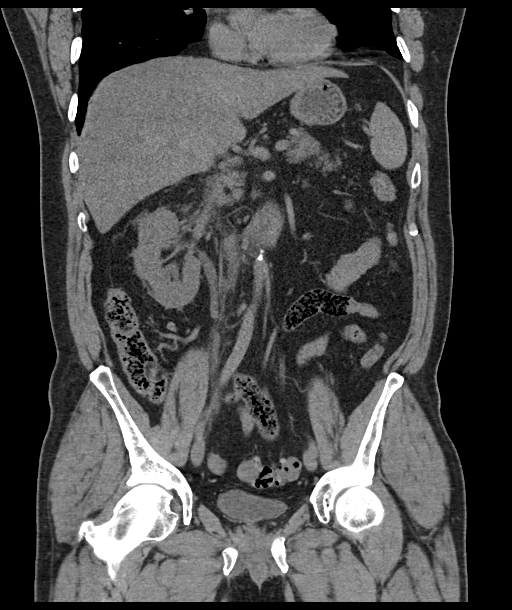
[im 94/170  soft-tissue]
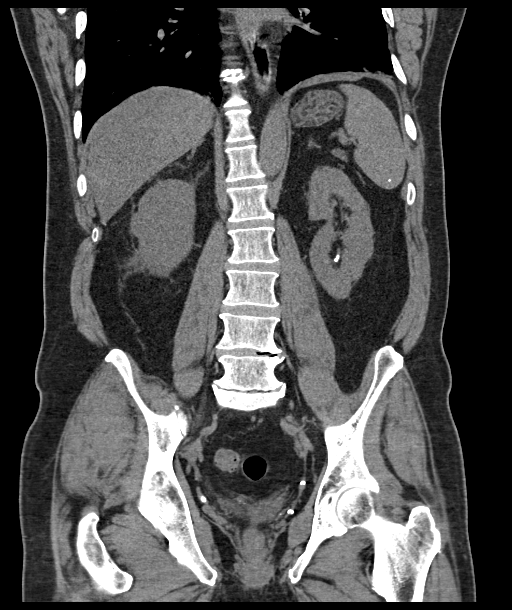

[16 of 46 positions shown; findings below may reference images not displayed]

FINDINGS: Lower chest: Chronic pleural and parenchymal scarring at the left
base.

Hepatobiliary: Chronic 9 mm cyst in the ventral liver. Diffuse fatty
change of the liver. Tiny wall calcification of the gallbladder. No
calcified gallstones are seen.

Pancreas: Normal

Spleen: Normal.  Scattered granulomas.

Adrenals/Urinary Tract: Adrenal glands are normal. Left kidney
contains multiple nonobstructing calculi, the largest measuring 6 mm
in the lower pole. No sign of passing stone on the left. On the
right, there is renal swelling with surrounding edema. There are
nonobstructing renal calculi, the largest in the lower pole
measuring 7 mm. There is hydroureteronephrosis with the ureter being
dilated to the level just proximal to the UVJ. There is an
obstructing 3 mm stone in this location. No stone in the bladder.

Stomach/Bowel: Normal except for sigmoid diverticulosis.

Vascular/Lymphatic: Aortic atherosclerosis. No aneurysm. IVC is
normal. No retroperitoneal adenopathy.

Reproductive: Normal

Other: No free fluid or air.

Musculoskeletal: Ordinary spondylosis.
IMPRESSION: Hydroureteronephrosis on the right due to a 3 mm stone just proximal
to the UVJ.

Several nonobstructing renal calculi in each kidney.

Diffuse fatty change of the liver. Chronic 9 mm cyst in the ventral
liver.

Aortic atherosclerosis.

## 2020-04-26 DIAGNOSIS — Z79899 Other long term (current) drug therapy: Secondary | ICD-10-CM | POA: Diagnosis not present

## 2020-04-26 DIAGNOSIS — F909 Attention-deficit hyperactivity disorder, unspecified type: Secondary | ICD-10-CM | POA: Diagnosis not present

## 2020-06-01 DIAGNOSIS — Z0000 Encounter for general adult medical examination without abnormal findings: Secondary | ICD-10-CM | POA: Diagnosis not present

## 2020-06-01 DIAGNOSIS — Z13220 Encounter for screening for lipoid disorders: Secondary | ICD-10-CM | POA: Diagnosis not present

## 2020-06-01 DIAGNOSIS — Z125 Encounter for screening for malignant neoplasm of prostate: Secondary | ICD-10-CM | POA: Diagnosis not present

## 2020-06-01 DIAGNOSIS — E1169 Type 2 diabetes mellitus with other specified complication: Secondary | ICD-10-CM | POA: Diagnosis not present

## 2020-06-05 DIAGNOSIS — E119 Type 2 diabetes mellitus without complications: Secondary | ICD-10-CM | POA: Diagnosis not present

## 2020-08-02 DIAGNOSIS — Z79899 Other long term (current) drug therapy: Secondary | ICD-10-CM | POA: Diagnosis not present

## 2020-08-02 DIAGNOSIS — F909 Attention-deficit hyperactivity disorder, unspecified type: Secondary | ICD-10-CM | POA: Diagnosis not present

## 2020-11-07 DIAGNOSIS — Z03818 Encounter for observation for suspected exposure to other biological agents ruled out: Secondary | ICD-10-CM | POA: Diagnosis not present

## 2020-11-07 DIAGNOSIS — R051 Acute cough: Secondary | ICD-10-CM | POA: Diagnosis not present

## 2020-11-07 DIAGNOSIS — J069 Acute upper respiratory infection, unspecified: Secondary | ICD-10-CM | POA: Diagnosis not present

## 2020-11-07 DIAGNOSIS — J209 Acute bronchitis, unspecified: Secondary | ICD-10-CM | POA: Diagnosis not present

## 2021-02-02 DIAGNOSIS — U071 COVID-19: Secondary | ICD-10-CM | POA: Diagnosis not present

## 2021-02-07 DIAGNOSIS — R059 Cough, unspecified: Secondary | ICD-10-CM | POA: Diagnosis not present

## 2021-03-17 DIAGNOSIS — F902 Attention-deficit hyperactivity disorder, combined type: Secondary | ICD-10-CM | POA: Diagnosis not present

## 2021-03-17 DIAGNOSIS — Z79899 Other long term (current) drug therapy: Secondary | ICD-10-CM | POA: Diagnosis not present

## 2021-04-03 DIAGNOSIS — M5412 Radiculopathy, cervical region: Secondary | ICD-10-CM | POA: Diagnosis not present

## 2021-04-22 DIAGNOSIS — M25521 Pain in right elbow: Secondary | ICD-10-CM | POA: Diagnosis not present

## 2021-05-20 DIAGNOSIS — E559 Vitamin D deficiency, unspecified: Secondary | ICD-10-CM | POA: Diagnosis not present

## 2021-05-20 DIAGNOSIS — Z0000 Encounter for general adult medical examination without abnormal findings: Secondary | ICD-10-CM | POA: Diagnosis not present

## 2021-05-20 DIAGNOSIS — R200 Anesthesia of skin: Secondary | ICD-10-CM | POA: Diagnosis not present

## 2021-05-20 DIAGNOSIS — Z13220 Encounter for screening for lipoid disorders: Secondary | ICD-10-CM | POA: Diagnosis not present

## 2021-05-20 DIAGNOSIS — Z125 Encounter for screening for malignant neoplasm of prostate: Secondary | ICD-10-CM | POA: Diagnosis not present

## 2021-05-20 DIAGNOSIS — E1169 Type 2 diabetes mellitus with other specified complication: Secondary | ICD-10-CM | POA: Diagnosis not present

## 2021-05-27 ENCOUNTER — Other Ambulatory Visit: Payer: Self-pay | Admitting: Family Medicine

## 2021-05-27 DIAGNOSIS — E1169 Type 2 diabetes mellitus with other specified complication: Secondary | ICD-10-CM

## 2021-05-27 DIAGNOSIS — E785 Hyperlipidemia, unspecified: Secondary | ICD-10-CM

## 2021-05-27 DIAGNOSIS — E559 Vitamin D deficiency, unspecified: Secondary | ICD-10-CM | POA: Diagnosis not present

## 2021-05-27 DIAGNOSIS — Z0000 Encounter for general adult medical examination without abnormal findings: Secondary | ICD-10-CM | POA: Diagnosis not present

## 2021-05-27 DIAGNOSIS — M5412 Radiculopathy, cervical region: Secondary | ICD-10-CM | POA: Diagnosis not present

## 2021-05-27 DIAGNOSIS — Z8739 Personal history of other diseases of the musculoskeletal system and connective tissue: Secondary | ICD-10-CM | POA: Diagnosis not present

## 2021-07-01 DIAGNOSIS — R200 Anesthesia of skin: Secondary | ICD-10-CM | POA: Diagnosis not present

## 2021-09-10 DIAGNOSIS — H00015 Hordeolum externum left lower eyelid: Secondary | ICD-10-CM | POA: Diagnosis not present

## 2021-09-30 DIAGNOSIS — Z79899 Other long term (current) drug therapy: Secondary | ICD-10-CM | POA: Diagnosis not present

## 2021-09-30 DIAGNOSIS — F900 Attention-deficit hyperactivity disorder, predominantly inattentive type: Secondary | ICD-10-CM | POA: Diagnosis not present

## 2021-10-05 DIAGNOSIS — Z79899 Other long term (current) drug therapy: Secondary | ICD-10-CM | POA: Diagnosis not present

## 2021-10-05 DIAGNOSIS — F900 Attention-deficit hyperactivity disorder, predominantly inattentive type: Secondary | ICD-10-CM | POA: Diagnosis not present

## 2021-11-18 DIAGNOSIS — E1169 Type 2 diabetes mellitus with other specified complication: Secondary | ICD-10-CM | POA: Diagnosis not present

## 2021-11-25 DIAGNOSIS — E1169 Type 2 diabetes mellitus with other specified complication: Secondary | ICD-10-CM | POA: Diagnosis not present

## 2021-11-25 DIAGNOSIS — E1165 Type 2 diabetes mellitus with hyperglycemia: Secondary | ICD-10-CM | POA: Diagnosis not present

## 2022-03-28 DIAGNOSIS — B349 Viral infection, unspecified: Secondary | ICD-10-CM | POA: Diagnosis not present

## 2022-04-07 DIAGNOSIS — Z79899 Other long term (current) drug therapy: Secondary | ICD-10-CM | POA: Diagnosis not present

## 2022-04-07 DIAGNOSIS — F900 Attention-deficit hyperactivity disorder, predominantly inattentive type: Secondary | ICD-10-CM | POA: Diagnosis not present

## 2022-05-26 DIAGNOSIS — Z8739 Personal history of other diseases of the musculoskeletal system and connective tissue: Secondary | ICD-10-CM | POA: Diagnosis not present

## 2022-05-26 DIAGNOSIS — E1169 Type 2 diabetes mellitus with other specified complication: Secondary | ICD-10-CM | POA: Diagnosis not present

## 2022-05-26 DIAGNOSIS — E559 Vitamin D deficiency, unspecified: Secondary | ICD-10-CM | POA: Diagnosis not present

## 2022-05-26 DIAGNOSIS — Z13220 Encounter for screening for lipoid disorders: Secondary | ICD-10-CM | POA: Diagnosis not present

## 2022-05-26 DIAGNOSIS — Z125 Encounter for screening for malignant neoplasm of prostate: Secondary | ICD-10-CM | POA: Diagnosis not present

## 2022-05-26 DIAGNOSIS — Z0000 Encounter for general adult medical examination without abnormal findings: Secondary | ICD-10-CM | POA: Diagnosis not present

## 2022-06-01 DIAGNOSIS — Z0000 Encounter for general adult medical examination without abnormal findings: Secondary | ICD-10-CM | POA: Diagnosis not present

## 2022-06-01 DIAGNOSIS — E1169 Type 2 diabetes mellitus with other specified complication: Secondary | ICD-10-CM | POA: Diagnosis not present

## 2022-06-01 DIAGNOSIS — R972 Elevated prostate specific antigen [PSA]: Secondary | ICD-10-CM | POA: Diagnosis not present

## 2022-06-01 DIAGNOSIS — E559 Vitamin D deficiency, unspecified: Secondary | ICD-10-CM | POA: Diagnosis not present

## 2022-06-01 DIAGNOSIS — E7800 Pure hypercholesterolemia, unspecified: Secondary | ICD-10-CM | POA: Diagnosis not present

## 2022-06-12 DIAGNOSIS — R42 Dizziness and giddiness: Secondary | ICD-10-CM | POA: Diagnosis not present

## 2022-06-12 DIAGNOSIS — H6993 Unspecified Eustachian tube disorder, bilateral: Secondary | ICD-10-CM | POA: Diagnosis not present

## 2022-10-02 DIAGNOSIS — F902 Attention-deficit hyperactivity disorder, combined type: Secondary | ICD-10-CM | POA: Diagnosis not present

## 2022-10-02 DIAGNOSIS — Z79899 Other long term (current) drug therapy: Secondary | ICD-10-CM | POA: Diagnosis not present

## 2022-10-03 DIAGNOSIS — F902 Attention-deficit hyperactivity disorder, combined type: Secondary | ICD-10-CM | POA: Diagnosis not present

## 2022-11-12 DIAGNOSIS — J0110 Acute frontal sinusitis, unspecified: Secondary | ICD-10-CM | POA: Diagnosis not present

## 2022-11-12 DIAGNOSIS — R0981 Nasal congestion: Secondary | ICD-10-CM | POA: Diagnosis not present

## 2022-11-25 DIAGNOSIS — B9689 Other specified bacterial agents as the cause of diseases classified elsewhere: Secondary | ICD-10-CM | POA: Diagnosis not present

## 2022-11-25 DIAGNOSIS — J019 Acute sinusitis, unspecified: Secondary | ICD-10-CM | POA: Diagnosis not present

## 2022-11-25 DIAGNOSIS — H6591 Unspecified nonsuppurative otitis media, right ear: Secondary | ICD-10-CM | POA: Diagnosis not present

## 2022-12-01 DIAGNOSIS — R972 Elevated prostate specific antigen [PSA]: Secondary | ICD-10-CM | POA: Diagnosis not present

## 2022-12-01 DIAGNOSIS — E1169 Type 2 diabetes mellitus with other specified complication: Secondary | ICD-10-CM | POA: Diagnosis not present

## 2022-12-06 DIAGNOSIS — E1169 Type 2 diabetes mellitus with other specified complication: Secondary | ICD-10-CM | POA: Diagnosis not present

## 2023-01-17 DIAGNOSIS — U071 COVID-19: Secondary | ICD-10-CM | POA: Diagnosis not present

## 2023-03-31 DIAGNOSIS — R197 Diarrhea, unspecified: Secondary | ICD-10-CM | POA: Diagnosis not present

## 2023-04-16 DIAGNOSIS — Z79899 Other long term (current) drug therapy: Secondary | ICD-10-CM | POA: Diagnosis not present

## 2023-04-16 DIAGNOSIS — F902 Attention-deficit hyperactivity disorder, combined type: Secondary | ICD-10-CM | POA: Diagnosis not present

## 2023-04-17 DIAGNOSIS — Z79899 Other long term (current) drug therapy: Secondary | ICD-10-CM | POA: Diagnosis not present

## 2023-04-17 DIAGNOSIS — F909 Attention-deficit hyperactivity disorder, unspecified type: Secondary | ICD-10-CM | POA: Diagnosis not present

## 2023-06-04 DIAGNOSIS — E559 Vitamin D deficiency, unspecified: Secondary | ICD-10-CM | POA: Diagnosis not present

## 2023-06-04 DIAGNOSIS — Z0000 Encounter for general adult medical examination without abnormal findings: Secondary | ICD-10-CM | POA: Diagnosis not present

## 2023-06-04 DIAGNOSIS — E1169 Type 2 diabetes mellitus with other specified complication: Secondary | ICD-10-CM | POA: Diagnosis not present

## 2023-06-04 DIAGNOSIS — Z125 Encounter for screening for malignant neoplasm of prostate: Secondary | ICD-10-CM | POA: Diagnosis not present

## 2023-06-04 DIAGNOSIS — E7800 Pure hypercholesterolemia, unspecified: Secondary | ICD-10-CM | POA: Diagnosis not present

## 2023-10-08 DIAGNOSIS — F902 Attention-deficit hyperactivity disorder, combined type: Secondary | ICD-10-CM | POA: Diagnosis not present

## 2023-10-08 DIAGNOSIS — Z79899 Other long term (current) drug therapy: Secondary | ICD-10-CM | POA: Diagnosis not present

## 2023-11-03 DIAGNOSIS — N202 Calculus of kidney with calculus of ureter: Secondary | ICD-10-CM | POA: Diagnosis not present

## 2023-11-03 DIAGNOSIS — N139 Obstructive and reflux uropathy, unspecified: Secondary | ICD-10-CM | POA: Diagnosis not present

## 2023-11-03 DIAGNOSIS — E785 Hyperlipidemia, unspecified: Secondary | ICD-10-CM | POA: Diagnosis not present

## 2023-11-03 DIAGNOSIS — R7989 Other specified abnormal findings of blood chemistry: Secondary | ICD-10-CM | POA: Diagnosis not present

## 2023-11-03 DIAGNOSIS — N400 Enlarged prostate without lower urinary tract symptoms: Secondary | ICD-10-CM | POA: Diagnosis not present

## 2023-11-03 DIAGNOSIS — N3289 Other specified disorders of bladder: Secondary | ICD-10-CM | POA: Diagnosis not present

## 2023-11-03 DIAGNOSIS — N179 Acute kidney failure, unspecified: Secondary | ICD-10-CM | POA: Diagnosis not present

## 2023-11-03 DIAGNOSIS — N201 Calculus of ureter: Secondary | ICD-10-CM | POA: Diagnosis not present

## 2023-11-03 DIAGNOSIS — Z79899 Other long term (current) drug therapy: Secondary | ICD-10-CM | POA: Diagnosis not present

## 2023-11-03 DIAGNOSIS — F909 Attention-deficit hyperactivity disorder, unspecified type: Secondary | ICD-10-CM | POA: Diagnosis not present

## 2023-11-03 DIAGNOSIS — K760 Fatty (change of) liver, not elsewhere classified: Secondary | ICD-10-CM | POA: Diagnosis not present

## 2023-11-03 DIAGNOSIS — Z794 Long term (current) use of insulin: Secondary | ICD-10-CM | POA: Diagnosis not present

## 2023-11-03 DIAGNOSIS — N132 Hydronephrosis with renal and ureteral calculous obstruction: Secondary | ICD-10-CM | POA: Diagnosis not present

## 2023-11-03 DIAGNOSIS — E1165 Type 2 diabetes mellitus with hyperglycemia: Secondary | ICD-10-CM | POA: Diagnosis not present

## 2023-11-04 DIAGNOSIS — Z960 Presence of urogenital implants: Secondary | ICD-10-CM | POA: Diagnosis not present

## 2023-11-04 DIAGNOSIS — E1165 Type 2 diabetes mellitus with hyperglycemia: Secondary | ICD-10-CM | POA: Diagnosis not present

## 2023-11-04 DIAGNOSIS — N202 Calculus of kidney with calculus of ureter: Secondary | ICD-10-CM | POA: Diagnosis not present

## 2023-11-04 DIAGNOSIS — N139 Obstructive and reflux uropathy, unspecified: Secondary | ICD-10-CM | POA: Diagnosis not present

## 2023-11-04 DIAGNOSIS — Z794 Long term (current) use of insulin: Secondary | ICD-10-CM | POA: Diagnosis not present

## 2023-11-06 DIAGNOSIS — Z960 Presence of urogenital implants: Secondary | ICD-10-CM | POA: Diagnosis not present

## 2023-11-06 DIAGNOSIS — Z7985 Long-term (current) use of injectable non-insulin antidiabetic drugs: Secondary | ICD-10-CM | POA: Diagnosis not present

## 2023-11-06 DIAGNOSIS — R109 Unspecified abdominal pain: Secondary | ICD-10-CM | POA: Diagnosis not present

## 2023-11-06 DIAGNOSIS — N132 Hydronephrosis with renal and ureteral calculous obstruction: Secondary | ICD-10-CM | POA: Diagnosis not present

## 2023-11-06 DIAGNOSIS — N200 Calculus of kidney: Secondary | ICD-10-CM | POA: Diagnosis not present

## 2023-11-06 DIAGNOSIS — Z79899 Other long term (current) drug therapy: Secondary | ICD-10-CM | POA: Diagnosis not present

## 2023-11-06 DIAGNOSIS — Z7982 Long term (current) use of aspirin: Secondary | ICD-10-CM | POA: Diagnosis not present

## 2023-11-06 DIAGNOSIS — K760 Fatty (change of) liver, not elsewhere classified: Secondary | ICD-10-CM | POA: Diagnosis not present

## 2023-11-06 DIAGNOSIS — Z7984 Long term (current) use of oral hypoglycemic drugs: Secondary | ICD-10-CM | POA: Diagnosis not present

## 2023-11-06 DIAGNOSIS — R310 Gross hematuria: Secondary | ICD-10-CM | POA: Diagnosis not present

## 2023-11-06 DIAGNOSIS — E119 Type 2 diabetes mellitus without complications: Secondary | ICD-10-CM | POA: Diagnosis not present

## 2023-11-16 DIAGNOSIS — Z01812 Encounter for preprocedural laboratory examination: Secondary | ICD-10-CM | POA: Diagnosis not present

## 2023-11-16 DIAGNOSIS — N201 Calculus of ureter: Secondary | ICD-10-CM | POA: Diagnosis not present

## 2023-11-30 DIAGNOSIS — N201 Calculus of ureter: Secondary | ICD-10-CM | POA: Diagnosis not present

## 2023-12-07 DIAGNOSIS — E1169 Type 2 diabetes mellitus with other specified complication: Secondary | ICD-10-CM | POA: Diagnosis not present

## 2023-12-07 DIAGNOSIS — N201 Calculus of ureter: Secondary | ICD-10-CM | POA: Diagnosis not present

## 2023-12-07 DIAGNOSIS — N200 Calculus of kidney: Secondary | ICD-10-CM | POA: Diagnosis not present

## 2024-02-08 DIAGNOSIS — J029 Acute pharyngitis, unspecified: Secondary | ICD-10-CM | POA: Diagnosis not present

## 2024-02-08 DIAGNOSIS — J069 Acute upper respiratory infection, unspecified: Secondary | ICD-10-CM | POA: Diagnosis not present
